# Patient Record
Sex: Female | Born: 1945
Health system: Southern US, Community
[De-identification: ages and names within clinical notes are randomized; demographics above are authoritative.]

## PROBLEM LIST (undated history)

## (undated) DIAGNOSIS — M1990 Unspecified osteoarthritis, unspecified site: Secondary | ICD-10-CM

## (undated) DIAGNOSIS — I10 Essential (primary) hypertension: Secondary | ICD-10-CM

## (undated) DIAGNOSIS — E785 Hyperlipidemia, unspecified: Secondary | ICD-10-CM

## (undated) HISTORY — DX: Essential (primary) hypertension: I10

## (undated) HISTORY — PX: EYE SURGERY: SHX253

## (undated) HISTORY — DX: Unspecified osteoarthritis, unspecified site: M19.90

## (undated) HISTORY — PX: APPENDECTOMY: SHX54

## (undated) HISTORY — PX: ABDOMINAL HYSTERECTOMY: SHX81

## (undated) HISTORY — PX: JOINT REPLACEMENT: SHX530

## (undated) HISTORY — DX: Hyperlipidemia, unspecified: E78.5

---

## 2019-06-10 ENCOUNTER — Encounter: Payer: Self-pay | Admitting: Family Medicine

## 2019-06-10 ENCOUNTER — Ambulatory Visit (INDEPENDENT_AMBULATORY_CARE_PROVIDER_SITE_OTHER): Payer: Medicare Other | Admitting: Family Medicine

## 2019-06-10 ENCOUNTER — Other Ambulatory Visit: Payer: Self-pay

## 2019-06-10 VITALS — BP 135/84 | HR 72 | Temp 98.1°F | Ht 67.0 in | Wt 150.0 lb

## 2019-06-10 DIAGNOSIS — Z1231 Encounter for screening mammogram for malignant neoplasm of breast: Secondary | ICD-10-CM | POA: Diagnosis not present

## 2019-06-10 DIAGNOSIS — E785 Hyperlipidemia, unspecified: Secondary | ICD-10-CM | POA: Insufficient documentation

## 2019-06-10 DIAGNOSIS — E7800 Pure hypercholesterolemia, unspecified: Secondary | ICD-10-CM

## 2019-06-10 DIAGNOSIS — Z7689 Persons encountering health services in other specified circumstances: Secondary | ICD-10-CM

## 2019-06-10 DIAGNOSIS — Z1159 Encounter for screening for other viral diseases: Secondary | ICD-10-CM

## 2019-06-10 DIAGNOSIS — I10 Essential (primary) hypertension: Secondary | ICD-10-CM | POA: Diagnosis not present

## 2019-06-10 DIAGNOSIS — Z1211 Encounter for screening for malignant neoplasm of colon: Secondary | ICD-10-CM

## 2019-06-10 MED ORDER — ATORVASTATIN CALCIUM 40 MG PO TABS: 40.0000 mg | ORAL_TABLET | Freq: Every day | ORAL | 3 refills | Status: DC

## 2019-06-10 MED ORDER — LOSARTAN POTASSIUM 100 MG PO TABS: 100.0000 mg | ORAL_TABLET | Freq: Every day | ORAL | 1 refills | Status: DC
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
## 2019-06-10 NOTE — Patient Instructions (Signed)
° ° ° °  If you have lab work done today you will be contacted with your lab results within the next 2 weeks.  If you have not heard from us then please contact us. The fastest way to get your results is to register for My Chart. ° ° °IF you received an x-ray today, you will receive an invoice from [facility]. Please contact [facility] at [phone] with questions or concerns regarding your invoice.  ° °IF you received labwork today, you will receive an invoice from [facility]. Please contact [facility] at [phone] with questions or concerns regarding your invoice.  ° °Our billing staff will not be able to assist you with questions regarding bills from these companies. ° °You will be contacted with the lab results as soon as they are available. The fastest way to get your results is to activate your My Chart account. Instructions are located on the last page of this paperwork. If you have not heard from us regarding the results in 2 weeks, please contact this office. °  ° ° ° °

## 2019-06-11 LAB — CMP14+EGFR
ALT: 14 IU/L (ref 0–32)
AST: 26 IU/L (ref 0–40)
Albumin/Globulin Ratio: 1.7 (ref 1.2–2.2)
Albumin: 4.5 g/dL (ref 3.7–4.7)
Alkaline Phosphatase: 72 IU/L (ref 39–117)
BUN/Creatinine Ratio: 18 (ref 12–28)
BUN: 12 mg/dL (ref 8–27)
Bilirubin Total: 0.5 mg/dL (ref 0.0–1.2)
CO2: 26 mmol/L (ref 20–29)
Calcium: 9.1 mg/dL (ref 8.7–10.3)
Chloride: 102 mmol/L (ref 96–106)
Creatinine, Ser: 0.67 mg/dL (ref 0.57–1.00)
GFR calc Af Amer: 99 mL/min/{1.73_m2} (ref >59)
GFR calc non Af Amer: 86 mL/min/{1.73_m2} (ref >59)
Globulin, Total: 2.7 g/dL (ref 1.5–4.5)
Glucose: 76 mg/dL (ref 65–99)
Potassium: 4 mmol/L (ref 3.5–5.2)
Sodium: 142 mmol/L (ref 134–144)
Total Protein: 7.2 g/dL (ref 6.0–8.5)

## 2019-06-11 LAB — LIPID PANEL
Chol/HDL Ratio: 2.1 ratio (ref 0.0–4.4)
Cholesterol, Total: 172 mg/dL (ref 100–199)
HDL: 81 mg/dL (ref >39)
LDL Chol Calc (NIH): 81 mg/dL (ref 0–99)
Triglycerides: 48 mg/dL (ref 0–149)
VLDL Cholesterol Cal: 10 mg/dL (ref 5–40)

## 2019-06-11 LAB — HEPATITIS C ANTIBODY: Hep C Virus Ab: <0.1 s/co ratio (ref 0.0–0.9)

## 2019-06-11 LAB — TSH: TSH: 2.51 u[IU]/mL (ref 0.450–4.500)

## 2019-06-16 ENCOUNTER — Encounter: Payer: Self-pay | Admitting: Radiology

## 2019-07-04 LAB — COLOGUARD: Cologuard: NEGATIVE

## 2019-07-08 NOTE — Progress Notes (Signed)
Spoke to pt, all is well 

## 2019-10-01 ENCOUNTER — Telehealth: Payer: Self-pay | Admitting: *Deleted

## 2019-10-01 NOTE — Telephone Encounter (Signed)
Schedule AWV.  

## 2019-10-02 ENCOUNTER — Telehealth: Payer: Self-pay | Admitting: Family Medicine

## 2019-10-02 NOTE — Telephone Encounter (Signed)
Pt returning call to schedule awv

## 2019-12-08 ENCOUNTER — Ambulatory Visit (INDEPENDENT_AMBULATORY_CARE_PROVIDER_SITE_OTHER): Payer: Medicare HMO

## 2019-12-08 ENCOUNTER — Other Ambulatory Visit: Payer: Self-pay

## 2019-12-08 ENCOUNTER — Ambulatory Visit (INDEPENDENT_AMBULATORY_CARE_PROVIDER_SITE_OTHER): Payer: Medicare HMO | Admitting: Family Medicine

## 2019-12-08 ENCOUNTER — Encounter: Payer: Self-pay | Admitting: Family Medicine

## 2019-12-08 VITALS — BP 104/86 | HR 67 | Temp 98.0°F | Ht 67.0 in | Wt 155.2 lb

## 2019-12-08 DIAGNOSIS — M7989 Other specified soft tissue disorders: Secondary | ICD-10-CM | POA: Diagnosis not present

## 2019-12-08 DIAGNOSIS — E7800 Pure hypercholesterolemia, unspecified: Secondary | ICD-10-CM

## 2019-12-08 DIAGNOSIS — M25441 Effusion, right hand: Secondary | ICD-10-CM | POA: Diagnosis not present

## 2019-12-08 DIAGNOSIS — I10 Essential (primary) hypertension: Secondary | ICD-10-CM | POA: Diagnosis not present

## 2019-12-08 MED ORDER — LOSARTAN POTASSIUM 100 MG PO TABS: 100.0000 mg | ORAL_TABLET | Freq: Every day | ORAL | 1 refills | Status: DC

## 2019-12-08 MED ORDER — ATORVASTATIN CALCIUM 40 MG PO TABS: 40.0000 mg | ORAL_TABLET | Freq: Every day | ORAL | 3 refills | Status: DC

## 2019-12-08 NOTE — Progress Notes (Signed)
5/17/20211:41 PM  Sandra Mcbride 07-Dec-1945, 74 y.o., female [id number]  Chief Complaint  Patient presents with  . Hypertension  . Pain    in 2 of the fingers, taking 585m of otc tylenol for the pain    HPI:   Patient is a 74y.o. female with past medical history significant for HTN and HLP who presents today for routine followup  Last OV nov 2020  She is overall doing well She has completed covid vaccines 1/19 and 2/9, pzifer She has bought a stationary bike Her grandson (whom lives with her) is graduating from highschool  Right 3rd and 4th finger IP joint swelling and stiffness No trauma Present for several months   [facility] 06/10/2019  Decreased Interest 0 0  Down, Depressed, Hopeless 0 0  PHQ - 2 Score 0 0    Fall Risk  12/08/2019 06/10/2019  Falls in the past year? 0 0  Number falls in past yr: 0 0  Injury with Fall? 0 0     No Known Allergies  Prior to Admission medications   Medication Sig Start Date End Date Taking? Authorizing Provider  atorvastatin (LIPITOR) 40 MG tablet Take 1 tablet (40 mg total) by mouth daily at 6 PM. 06/10/19  Yes SRutherford Guys MD  losartan (COZAAR) 100 MG tablet Take 1 tablet (100 mg total) by mouth daily. 06/10/19  Yes SRutherford Guys MD    Past Medical History:  Diagnosis Date  . Arthritis   . Hyperlipidemia   . Hypertension     Past Surgical History:  Procedure Laterality Date  . ABDOMINAL HYSTERECTOMY  1980s   partial, cervix remains, for fibroids  . APPENDECTOMY    . EYE SURGERY     cataracts, both eye    Social History   Tobacco Use  . Smoking status: Never Smoker  . Smokeless tobacco: Never Used  Substance Use Topics  . Alcohol use: Yes    Family History  Problem Relation Age of Onset  . Heart disease Mother   . Hypertension Mother   . Heart disease Father   . Hypertension Father   . Heart disease Brother   . Hypertension Brother   . Heart disease Daughter   .  Hypertension Daughter   . Schizophrenia Son   . Intellectual disability Son     Review of Systems  Constitutional: Negative for chills and fever.  Respiratory: Negative for cough and shortness of breath.   Cardiovascular: Negative for chest pain, palpitations and leg swelling.  Gastrointestinal: Negative for abdominal pain, nausea and vomiting.     OBJECTIVE:  Today's Vitals   12/08/19 1326  BP: 104/86  Pulse: 67  Temp: 98 F (36.7 C)  SpO2: 93%  Weight: 155 lb 3.2 oz (70.4 kg)  Height: 5' 7" (1.702 m)   Body mass index is 24.31 kg/m.   Wt Readings from Last 3 Encounters:  12/08/19 155 lb 3.2 oz (70.4 kg)  06/10/19 150 lb (68 kg)     Physical Exam Vitals and nursing note reviewed.  Constitutional:      Appearance: She is well-developed.  HENT:     Head: Normocephalic and atraumatic.     Mouth/Throat:     Pharynx: No oropharyngeal exudate.  Eyes:     General: No scleral icterus.    Conjunctiva/sclera: Conjunctivae normal.     Pupils: Pupils are equal, round, and reactive to light.  Cardiovascular:     Rate and Rhythm: Normal  rate and regular rhythm.     Heart sounds: Normal heart sounds. No murmur. No friction rub. No gallop.   Pulmonary:     Effort: Pulmonary effort is normal.     Breath sounds: Normal breath sounds. No wheezing or rales.  Musculoskeletal:     Cervical back: Neck supple.  Skin:    General: Skin is warm and dry.  Neurological:     Mental Status: She is alert and oriented to person, place, and time.     No results found for this or any previous visit (from the past 24 hour(s)).  DG Hand Complete Right  Result Date: 12/08/2019 CLINICAL DATA:  Right hand swelling for several months without known injury. EXAM: RIGHT HAND - COMPLETE 3+ VIEW COMPARISON:  None. FINDINGS: There is no evidence of fracture or dislocation. There is no evidence of arthropathy or other focal bone abnormality. Soft tissues are unremarkable. IMPRESSION: Negative.  Electronically Signed   By: Marijo Conception M.D.   On: 12/08/2019 14:58     ASSESSMENT and PLAN  1. Essential hypertension Controlled. Continue current regime.  - Lipid panel - CMP14+EGFR  2. Pure hypercholesterolemia Checking labs today, medications will be adjusted as needed.  - Lipid panel - CMP14+EGFR  3. Finger joint swelling, right Discussed conservative measures - DG Hand Complete Right - unremarkable  Other orders - atorvastatin (LIPITOR) 40 MG tablet; Take 1 tablet (40 mg total) by mouth daily at 6 PM. - losartan (COZAAR) 100 MG tablet; Take 1 tablet (100 mg total) by mouth daily.  Return in about 6 months (around 06/09/2020).    Rutherford Guys, MD Primary Care at [facility] [place], [place] [address] Ph.  [phone] Fax [phone]

## 2019-12-08 NOTE — Patient Instructions (Signed)
° ° ° °  If you have lab work done today you will be contacted with your lab results within the next 2 weeks.  If you have not heard from us then please contact us. The fastest way to get your results is to register for My Chart. ° ° °IF you received an x-ray today, you will receive an invoice from [facility]. Please contact [facility] at [phone] with questions or concerns regarding your invoice.  ° °IF you received labwork today, you will receive an invoice from [facility]. Please contact [facility] at [phone] with questions or concerns regarding your invoice.  ° °Our billing staff will not be able to assist you with questions regarding bills from these companies. ° °You will be contacted with the lab results as soon as they are available. The fastest way to get your results is to activate your My Chart account. Instructions are located on the last page of this paperwork. If you have not heard from us regarding the results in 2 weeks, please contact this office. °  ° ° ° °

## 2019-12-09 LAB — CMP14+EGFR
ALT: 13 IU/L (ref 0–32)
AST: 24 IU/L (ref 0–40)
Albumin/Globulin Ratio: 1.5 (ref 1.2–2.2)
Albumin: 4.4 g/dL (ref 3.7–4.7)
Alkaline Phosphatase: 66 IU/L (ref 48–121)
BUN/Creatinine Ratio: 13 (ref 12–28)
BUN: 9 mg/dL (ref 8–27)
Bilirubin Total: 0.9 mg/dL (ref 0.0–1.2)
CO2: 26 mmol/L (ref 20–29)
Calcium: 9.4 mg/dL (ref 8.7–10.3)
Chloride: 104 mmol/L (ref 96–106)
Creatinine, Ser: 0.68 mg/dL (ref 0.57–1.00)
GFR calc Af Amer: 99 mL/min/{1.73_m2} (ref >59)
GFR calc non Af Amer: 86 mL/min/{1.73_m2} (ref >59)
Globulin, Total: 2.9 g/dL (ref 1.5–4.5)
Glucose: 87 mg/dL (ref 65–99)
Potassium: 4.4 mmol/L (ref 3.5–5.2)
Sodium: 143 mmol/L (ref 134–144)
Total Protein: 7.3 g/dL (ref 6.0–8.5)

## 2019-12-09 LAB — LIPID PANEL
Chol/HDL Ratio: 2.5 ratio (ref 0.0–4.4)
Cholesterol, Total: 179 mg/dL (ref 100–199)
HDL: 72 mg/dL (ref >39)
LDL Chol Calc (NIH): 94 mg/dL (ref 0–99)
Triglycerides: 67 mg/dL (ref 0–149)
VLDL Cholesterol Cal: 13 mg/dL (ref 5–40)

## 2020-01-13 ENCOUNTER — Telehealth: Payer: Self-pay | Admitting: *Deleted

## 2020-01-13 ENCOUNTER — Ambulatory Visit (INDEPENDENT_AMBULATORY_CARE_PROVIDER_SITE_OTHER): Payer: Medicare HMO | Admitting: Emergency Medicine

## 2020-01-13 VITALS — BP 104/76 | Ht 67.0 in | Wt 155.0 lb

## 2020-01-13 DIAGNOSIS — Z0000 Encounter for general adult medical examination without abnormal findings: Secondary | ICD-10-CM

## 2020-01-13 NOTE — Telephone Encounter (Signed)
Schedule AWV.  

## 2020-01-13 NOTE — Telephone Encounter (Signed)
Spoke with pharmacy the atorvastatin is ready for pick up  Patient advised.

## 2020-01-13 NOTE — Patient Instructions (Addendum)
Thank you for taking time to come for your Medicare Wellness Visit. I appreciate your ongoing commitment to your health goals. Please review the following plan we discussed and let me know if I can assist you in the future.  Shatora Weatherbee LPN  Preventive Care 76 Years and Older, Female Preventive care refers to lifestyle choices and visits with your health care provider that can promote health and wellness. This includes:  A yearly physical exam. This is also called an annual well check.  Regular dental and eye exams.  Immunizations.  Screening for certain conditions.  Healthy lifestyle choices, such as diet and exercise. What can I expect for my preventive care visit? Physical exam Your health care provider will check:  Height and weight. These may be used to calculate body mass index (BMI), which is a measurement that tells if you are at a healthy weight.  Heart rate and blood pressure.  Your skin for abnormal spots. Counseling Your health care provider may ask you questions about:  Alcohol, tobacco, and drug use.  Emotional well-being.  Home and relationship well-being.  Sexual activity.  Eating habits.  History of falls.  Memory and ability to understand (cognition).  Work and work environment.  Pregnancy and menstrual history. What immunizations do I need?  Influenza (flu) vaccine  This is recommended every year. Tetanus, diphtheria, and pertussis (Tdap) vaccine  You may need a Td booster every 10 years. Varicella (chickenpox) vaccine  You may need this vaccine if you have not already been vaccinated. Zoster (shingles) vaccine  You may need this after age 60. Pneumococcal conjugate (PCV13) vaccine  One dose is recommended after age 65. Pneumococcal polysaccharide (PPSV23) vaccine  One dose is recommended after age 65. Measles, mumps, and rubella (MMR) vaccine  You may need at least one dose of MMR if you were born in 1957 or later. You may also  need a second dose. Meningococcal conjugate (MenACWY) vaccine  You may need this if you have certain conditions. Hepatitis A vaccine  You may need this if you have certain conditions or if you travel or work in places where you may be exposed to hepatitis A. Hepatitis B vaccine  You may need this if you have certain conditions or if you travel or work in places where you may be exposed to hepatitis B. Haemophilus influenzae type b (Hib) vaccine  You may need this if you have certain conditions. You may receive vaccines as individual doses or as more than one vaccine together in one shot (combination vaccines). Talk with your health care provider about the risks and benefits of combination vaccines. What tests do I need? Blood tests  Lipid and cholesterol levels. These may be checked every 5 years, or more frequently depending on your overall health.  Hepatitis C test.  Hepatitis B test. Screening  Lung cancer screening. You may have this screening every year starting at age 55 if you have a 30-pack-year history of smoking and currently smoke or have quit within the past 15 years.  Colorectal cancer screening. All adults should have this screening starting at age 50 and continuing until age 75. Your health care provider may recommend screening at age 45 if you are at increased risk. You will have tests every 1-10 years, depending on your results and the type of screening test.  Diabetes screening. This is done by checking your blood sugar (glucose) after you have not eaten for a while (fasting). You may have this done every 1-3   years.  Mammogram. This may be done every 1-2 years. Talk with your health care provider about how often you should have regular mammograms.  BRCA-related cancer screening. This may be done if you have a family history of breast, ovarian, tubal, or peritoneal cancers. Other tests  Sexually transmitted disease (STD) testing.  Bone density scan. This is done  to screen for osteoporosis. You may have this done starting at age 94. Follow these instructions at home: Eating and drinking  Eat a diet that includes fresh fruits and vegetables, whole grains, lean protein, and low-fat dairy products. Limit your intake of foods with high amounts of sugar, saturated fats, and salt.  Take vitamin and mineral supplements as recommended by your health care provider.  Do not drink alcohol if your health care provider tells you not to drink.  If you drink alcohol: ? Limit how much you have to 0-1 drink a day. ? Be aware of how much alcohol is in your drink. In the [place] one drink equals one 12 oz bottle of beer (355 mL), one 5 oz glass of wine (148 mL), or one 1 oz glass of hard liquor (44 mL). Lifestyle  Take daily care of your teeth and gums.  Stay active. Exercise for at least 30 minutes on 5 or more days each week.  Do not use any products that contain nicotine or tobacco, such as cigarettes, e-cigarettes, and chewing tobacco. If you need help quitting, ask your health care provider.  If you are sexually active, practice safe sex. Use a condom or other form of protection in order to prevent STIs (sexually transmitted infections).  Talk with your health care provider about taking a low-dose aspirin or statin. What's next?  Go to your health care provider once a year for a well check visit.  Ask your health care provider how often you should have your eyes and teeth checked.  Stay up to date on all vaccines. This information is not intended to replace advice given to you by your health care provider. Make sure you discuss any questions you have with your health care provider. Document Revised: 07/04/2018 Document Reviewed: 07/04/2018 Elsevier Patient Education  2020 [employer].

## 2020-01-13 NOTE — Progress Notes (Signed)
Presents today for [employer] Visit   Date of last exam: 12-08-2019  Interpreter used for this visit? No  I connected with  Sandra Mcbride on 01/13/20 by a telpehone  and verified that I am speaking with the correct person using two identifiers.   I discussed the limitations of evaluation and management by telemedicine. The patient expressed understanding and agreed to proceed.    Patient Care Team: Sandra Lipps, MD as PCP - General (Family Medicine)   Other items to address today:   Discussed Eye/Dental Discussed Immunizations   Other Screening: Last screening for diabetes: 12/08/2019 Last lipid screening: 12-08-2019  ADVANCE DIRECTIVES: Discussed: yes On File: no Materials Provided: yes  Immunization status:  Immunization History  Administered Date(s) Administered  . PFIZER SARS-COV-2 Vaccination 08/12/2019, 09/02/2019     There are no preventive care reminders to display for this patient.   Functional Status Survey: Is the patient deaf or have difficulty hearing?: No Does the patient have difficulty seeing, even when wearing glasses/contacts?: No Does the patient have difficulty concentrating, remembering, or making decisions?: No Does the patient have difficulty walking or climbing stairs?: No Does the patient have difficulty dressing or bathing?: No Does the patient have difficulty doing errands alone such as visiting a doctor's office or shopping?: No   6CIT Screen 01/13/2020  What Year? 0 points  What month? 0 points  What time? 0 points  Count back from 20 0 points  Months in reverse 0 points  Repeat phrase 0 points  Total Score 0        Clinical Support from 01/13/2020 in Primary Care at [facility]  AUDIT-C Score 7       Home Environment:    No difficulty climbing stairs No scattered rugs No grab bars Adequate lighting/ no clutter Lives in one story home   Patient Active Problem List   Diagnosis Date Noted  .  Hyperlipidemia   . Hypertension      Past Medical History:  Diagnosis Date  . Arthritis   . Hyperlipidemia   . Hypertension      Past Surgical History:  Procedure Laterality Date  . ABDOMINAL HYSTERECTOMY  1980s   partial, cervix remains, for fibroids  . APPENDECTOMY    . EYE SURGERY     cataracts, both eye  . JOINT REPLACEMENT N/A    Phreesia 01/13/2020     Family History  Problem Relation Age of Onset  . Heart disease Mother   . Hypertension Mother   . Heart disease Father   . Hypertension Father   . Heart disease Brother   . Hypertension Brother   . Heart disease Daughter   . Hypertension Daughter   . Schizophrenia Son   . Intellectual disability Son      Social History   Socioeconomic History  . Marital status: Widowed    Spouse name: Not on file  . Number of children: Not on file  . Years of education: Not on file  . Highest education level: Not on file  Occupational History  . Not on file  Tobacco Use  . Smoking status: Never Smoker  . Smokeless tobacco: Never Used  Substance and Sexual Activity  . Alcohol use: Yes  . Drug use: Never  . Sexual activity: Not Currently  Other Topics Concern  . Not on file  Social History Narrative  . Not on file   Social Determinants of Health   Financial Resource Strain:   .  Difficulty of Paying Living Expenses:   Food Insecurity:   . Worried About Charity fundraiser in the Last Year:   . Arboriculturist in the Last Year:   Transportation Needs:   . Film/video editor (Medical):   Marland Kitchen Lack of Transportation (Non-Medical):   Physical Activity:   . Days of Exercise per Week:   . Minutes of Exercise per Session:   Stress:   . Feeling of Stress :   Social Connections:   . Frequency of Communication with Friends and Family:   . Frequency of Social Gatherings with Friends and Family:   . Attends Religious Services:   . Active Member of Clubs or Organizations:   . Attends Archivist  Meetings:   Marland Kitchen Marital Status:   Intimate Partner Violence:   . Fear of Current or Ex-Partner:   . Emotionally Abused:   Marland Kitchen Physically Abused:   . Sexually Abused:      No Known Allergies   Prior to Admission medications   Medication Sig Start Date End Date Taking? Authorizing Provider  atorvastatin (LIPITOR) 40 MG tablet Take 1 tablet (40 mg total) by mouth daily at 6 PM. 12/08/19  Yes Rutherford Guys, MD  losartan (COZAAR) 100 MG tablet Take 1 tablet (100 mg total) by mouth daily. 12/08/19  Yes Rutherford Guys, MD     Depression screen [facility] 2/9 01/13/2020 12/08/2019 06/10/2019  Decreased Interest 0 0 0  Down, Depressed, Hopeless 0 0 0  PHQ - 2 Score 0 0 0     Fall Risk  01/13/2020 12/08/2019 06/10/2019  Falls in the past year? 1 0 0  Number falls in past yr: 0 0 0  Injury with Fall? 0 0 0  Follow up Falls evaluation completed;Education provided - -      PHYSICAL EXAM: BP 104/76 Comment: not in clinic  Ht 5\' 7"  (1.702 m)   Wt 155 lb (70.3 kg)   BMI 24.28 kg/m    Wt Readings from Last 3 Encounters:  01/13/20 155 lb (70.3 kg)  12/08/19 155 lb 3.2 oz (70.4 kg)  06/10/19 150 lb (68 kg)       Education/Counseling provided regarding diet and exercise, prevention of chronic diseases, smoking/tobacco cessation, if applicable, and reviewed "Covered Medicare Preventive Services."

## 2020-02-09 ENCOUNTER — Other Ambulatory Visit: Payer: Self-pay | Admitting: Family Medicine

## 2020-02-20 ENCOUNTER — Ambulatory Visit (HOSPITAL_COMMUNITY)
Admission: EM | Admit: 2020-02-20 | Discharge: 2020-02-20 | Disposition: A | Discharge status: Home / Self Care | Payer: Medicare HMO | Attending: Emergency Medicine | Admitting: Emergency Medicine

## 2020-02-20 ENCOUNTER — Encounter (HOSPITAL_COMMUNITY): Payer: Self-pay

## 2020-02-20 DIAGNOSIS — R42 Dizziness and giddiness: Secondary | ICD-10-CM

## 2020-02-20 DIAGNOSIS — R110 Nausea: Secondary | ICD-10-CM

## 2020-02-20 DIAGNOSIS — R030 Elevated blood-pressure reading, without diagnosis of hypertension: Secondary | ICD-10-CM

## 2020-02-20 LAB — POCT URINALYSIS DIP (DEVICE)
Bilirubin Urine: NEGATIVE
Glucose, UA: NEGATIVE mg/dL
Hgb urine dipstick: NEGATIVE
Ketones, ur: NEGATIVE mg/dL
Leukocytes,Ua: NEGATIVE
Nitrite: NEGATIVE
Protein, ur: NEGATIVE mg/dL
Specific Gravity, Urine: 1.01 (ref 1.005–1.030)
Urobilinogen, UA: 0.2 mg/dL (ref 0.0–1.0)
pH: 7 (ref 5.0–8.0)

## 2020-02-20 NOTE — ED Triage Notes (Signed)
Pt c/o dizziness, nausea, hazy/blurry vision,  weakness acute onset approx 2 hours PTA while doing house chores.  Denies CP, SOB, slurred speech, extremity weakness/numbness.   Has not taken HTN medication yet today.  States she awoke at approx 0100 two nights ago with "indigestion" at mid-epigastric area and drank cold water and sat up in recliner and symptom resolved.  AOx4, smile symmetrical, grips equal/strong, no arm drift, legs equal/strong. EKG and status of pt given to Marilynn Rail, NP who advised for pt to be taken to tx room for further eval when room available.

## 2020-02-20 NOTE — Discharge Instructions (Signed)
I do recommend further evaluation in the ER

## 2020-02-20 NOTE — ED Provider Notes (Signed)
[facility]    CSN: [id number] Arrival date & time: 02/20/20  1440      History   Chief Complaint Chief Complaint  Patient presents with   Dizziness    HPI Sandra Mcbride is a 74 y.o. female.   Sandra Mcbride presents with complaints of episode of feeling dizzy, very weak, light headed, associated with nausea. This started now approximately 3.5 hours ago, around 12:30 approximately. She was driving at that time. No diaphoresis. No shortness of breath . No chest pain . No confusion or speech difficulties. No spinning sensation. She felt nervous. She feels her symptoms have improved some, but still with lightheadedness and still feeling nauseated. History of "vertigo attacks" years ago which somewhat felt similar. No vomiting. No recent obvious dehydration or diarrhea. No fevers. She has been eating and drinking. Extensive family history of cardiac events- both parents and her brother have passed from MI. She takes medication for her BP as well as for cholesterol, which she did take today. States her BP is not typically as elevated as it is today.    ROS per HPI, negative if not otherwise mentioned.      Past Medical History:  Diagnosis Date   Arthritis    Hyperlipidemia    Hypertension     Patient Active Problem List   Diagnosis Date Noted   Hyperlipidemia    Hypertension     Past Surgical History:  Procedure Laterality Date   ABDOMINAL HYSTERECTOMY  1980s   partial, cervix remains, for fibroids   APPENDECTOMY     EYE SURGERY     cataracts, both eye   JOINT REPLACEMENT N/A    [employer]reesia 01/13/2020    OB History   No obstetric history on file.      Home Medications    Prior to Admission medications   Medication Sig Start Date End Date Taking? Authorizing Provider  atorvastatin (LIPITOR) 40 MG tablet Take 1 tablet (40 mg total) by mouth daily at 6 PM. 12/08/19  Yes Myles Lipps, MD  losartan (COZAAR) 100 MG tablet Take 1 tablet (100 mg  total) by mouth daily. 12/08/19  Yes Myles Lipps, MD    Family History Family History  Problem Relation Age of Onset   Heart disease Mother    Hypertension Mother    Heart disease Father    Hypertension Father    Heart disease Brother    Hypertension Brother    Heart disease Daughter    Hypertension Daughter    Schizophrenia Son    Intellectual disability Son     Social History Social History   Tobacco Use   Smoking status: Never Smoker   Smokeless tobacco: Never Used  Substance Use Topics   Alcohol use: Yes   Drug use: Never     Allergies   Patient has no known allergies.   Review of Systems Review of Systems   Physical Exam Triage Vital Signs ED Triage Vitals [02/20/20 1452]  Enc Vitals Group     BP (!) 166/80     Pulse Rate 68     Resp 18     Temp 98.1 F (36.7 C)     Temp Source Oral     SpO2 96 %     Weight      Height      Head Circumference      Peak Flow      Pain Score      Pain Loc  Pain Edu?      Excl. in GC?    Orthostatic VS for the past 24 hrs:  BP- Lying Pulse- Lying BP- Sitting Pulse- Sitting BP- Standing at 0 minutes Pulse- Standing at 0 minutes  02/20/20 1626 (!) 190/93 66 186/76 61 185/82 69    Updated Vital Signs BP (!) 166/80 (BP Location: Right Arm)    Pulse 68    Temp 98.1 F (36.7 C) (Oral)    Resp 18    SpO2 96%   Visual Acuity Right Eye Distance:   Left Eye Distance:   Bilateral Distance:    Right Eye Near:   Left Eye Near:    Bilateral Near:     Physical Exam Constitutional:      General: She is not in acute distress.    Appearance: She is well-developed.  HENT:     Head: Normocephalic and atraumatic.  Eyes:     Extraocular Movements: Extraocular movements intact.     Conjunctiva/sclera: Conjunctivae normal.     Pupils: Pupils are equal, round, and reactive to light.  Cardiovascular:     Rate and Rhythm: Normal rate.  Pulmonary:     Effort: Pulmonary effort is normal.     Breath  sounds: Normal breath sounds.  Skin:    General: Skin is warm and dry.  Neurological:     General: No focal deficit present.     Mental Status: She is alert and oriented to person, place, and time.     Cranial Nerves: No cranial nerve deficit.     Sensory: Sensation is intact.     Coordination: Romberg sign positive. Finger-Nose-Finger Test and Heel to [facility] Test normal.     Gait: Gait normal.     Comments: Subjective weakness; dizziness with laying and with collection of orthostatics   Psychiatric:        Mood and Affect: Mood normal.        Behavior: Behavior normal.        Thought Content: Thought content normal.     EKG:  NSR rate of 72 . Previous EKG was available for review. No stwave changes as interpreted by me.   UC Treatments / Results  Labs (all labs ordered are listed, but only abnormal results are displayed) Labs Reviewed  POCT URINALYSIS DIP (DEVICE)    EKG   Radiology No results found.  Procedures Procedures (including critical care time)  Medications Ordered in UC Medications - No data to display  Initial Impression / Assessment and Plan / UC Course  I have reviewed the triage vital signs and the nursing notes.  Pertinent labs & imaging results that were available during my care of the patient were reviewed by me and considered in my medical decision making (see chart for details).      Sudden onset of dizziness, weakness, nausea in this 74 yo patient. htn history but without previous documented elevation in bp as she exhibits today. Not orthostatic. Urine sg normal and otherwise normal ua. Positive romberg. No chest pain . ekg reassuring. acs vs cva vs tia vs vertigo vs metabolic source of symptoms considered. Patient agreeable to more thorough evaluation of her symptoms in the ER now.  Final Clinical Impressions(s) / UC Diagnoses   Final diagnoses:  Dizziness  Elevated blood pressure reading  Nausea     Discharge Instructions     I do  recommend further evaluation in the ER    ED Prescriptions    None  PDMP not reviewed this encounter.   Georgetta Haber, NP 02/20/20 1650

## 2020-02-20 NOTE — ED Notes (Signed)
Pt recanted and stated that she DID take her HTN meds this morning and c/o dizziness with lying down, chronic in nature.

## 2020-03-08 ENCOUNTER — Ambulatory Visit: Payer: Medicare HMO | Admitting: Family Medicine

## 2020-06-08 ENCOUNTER — Ambulatory Visit: Payer: Medicare HMO | Admitting: Family Medicine

## 2020-07-13 ENCOUNTER — Encounter: Payer: Self-pay | Admitting: Emergency Medicine

## 2020-07-13 ENCOUNTER — Ambulatory Visit (INDEPENDENT_AMBULATORY_CARE_PROVIDER_SITE_OTHER): Payer: Medicare HMO | Admitting: Emergency Medicine

## 2020-07-13 ENCOUNTER — Other Ambulatory Visit: Payer: Self-pay

## 2020-07-13 VITALS — BP 160/87 | HR 66 | Temp 97.8°F | Resp 16 | Ht 67.0 in | Wt 154.0 lb

## 2020-07-13 DIAGNOSIS — E785 Hyperlipidemia, unspecified: Secondary | ICD-10-CM

## 2020-07-13 DIAGNOSIS — I10 Essential (primary) hypertension: Secondary | ICD-10-CM

## 2020-07-13 MED ORDER — ATORVASTATIN CALCIUM 40 MG PO TABS: 40.0000 mg | ORAL_TABLET | Freq: Every day | ORAL | 3 refills | Status: AC

## 2020-07-13 MED ORDER — LOSARTAN POTASSIUM-HCTZ 100-12.5 MG PO TABS: 1.0000 | ORAL_TABLET | Freq: Every day | ORAL | 3 refills | Status: DC

## 2020-07-13 NOTE — Patient Instructions (Signed)

## 2020-07-13 NOTE — Progress Notes (Signed)
Sandra Mcbride 74 y.o.   Chief Complaint  Patient presents with  . Medication Refill    Atorvastatin and Losartan and review medication for blood pressure    HISTORY OF PRESENT ILLNESS: This is a 74 y.o. female here for follow-up of hypertension and medication refill. Used to see Dr. Leretha Pol. Presently on losartan but blood pressure readings at home have been high. Takes atorvastatin as a precaution.  Strong family history of atherosclerosis. No other complaints or medical concerns today. Scheduled for transfer of care visit next March. BP Readings from Last 3 Encounters:  07/13/20 (!) 160/87  02/20/20 (!) 166/80  01/13/20 104/76    HPI   Prior to Admission medications   Medication Sig Start Date End Date Taking? Authorizing Provider  atorvastatin (LIPITOR) 40 MG tablet Take 1 tablet (40 mg total) by mouth daily at 6 PM. 12/08/19  Yes Lezlie Lye, Meda Coffee, MD  losartan (COZAAR) 100 MG tablet Take 1 tablet (100 mg total) by mouth daily. 12/08/19  Yes Lezlie Lye, Meda Coffee, MD    No Known Allergies  Patient Active Problem List   Diagnosis Date Noted  . Hyperlipidemia   . Hypertension     Past Medical History:  Diagnosis Date  . Arthritis   . Hyperlipidemia   . Hypertension     Past Surgical History:  Procedure Laterality Date  . ABDOMINAL HYSTERECTOMY  1980s   partial, cervix remains, for fibroids  . APPENDECTOMY    . EYE SURGERY     cataracts, both eye  . JOINT REPLACEMENT N/A    [employer]reesia 01/13/2020    Social History   Socioeconomic History  . Marital status: Widowed    Spouse name: Not on file  . Number of children: Not on file  . Years of education: Not on file  . Highest education level: Not on file  Occupational History  . Not on file  Tobacco Use  . Smoking status: Never Smoker  . Smokeless tobacco: Never Used  Substance and Sexual Activity  . Alcohol use: Yes  . Drug use: Never  . Sexual activity: Not Currently  Other Topics Concern  .  Not on file  Social History Narrative  . Not on file   Social Determinants of Health   Financial Resource Strain: Not on file  Food Insecurity: Not on file  Transportation Needs: Not on file  Physical Activity: Not on file  Stress: Not on file  Social Connections: Not on file  Intimate Partner Violence: Not on file    Family History  Problem Relation Age of Onset  . Heart disease Mother   . Hypertension Mother   . Heart disease Father   . Hypertension Father   . Heart disease Brother   . Hypertension Brother   . Heart disease Daughter   . Hypertension Daughter   . Schizophrenia Son   . Intellectual disability Son      Review of Systems  Constitutional: Negative.  Negative for chills and fever.  HENT: Negative.  Negative for congestion and sore throat.   Respiratory: Negative.  Negative for cough and shortness of breath.   Cardiovascular: Negative.  Negative for chest pain and palpitations.  Gastrointestinal: Negative.  Negative for abdominal pain, blood in stool, diarrhea, nausea and vomiting.  Genitourinary: Negative.  Negative for dysuria and hematuria.  Musculoskeletal: Positive for joint pain. Negative for myalgias and neck pain.  Skin: Negative.  Negative for rash.  Neurological: Negative.  Negative for dizziness and headaches.  All other systems reviewed and are negative.   Vitals:   07/13/20 1405  BP: (!) 160/87  Pulse: 66  Resp: 16  Temp: 97.8 F (36.6 C)  SpO2: 94%   Wt Readings from Last 3 Encounters:  07/13/20 154 lb (69.9 kg)  01/13/20 155 lb (70.3 kg)  12/08/19 155 lb 3.2 oz (70.4 kg)    Physical Exam Vitals reviewed.  Constitutional:      Appearance: Normal appearance.  HENT:     Head: Normocephalic.  Eyes:     Extraocular Movements: Extraocular movements intact.     Pupils: Pupils are equal, round, and reactive to light.  Cardiovascular:     Rate and Rhythm: Normal rate and regular rhythm.     Pulses: Normal pulses.     Heart  sounds: Normal heart sounds.  Pulmonary:     Effort: Pulmonary effort is normal.     Breath sounds: Normal breath sounds.  Musculoskeletal:        General: Normal range of motion.     Cervical back: Normal range of motion and neck supple.  Skin:    General: Skin is warm and dry.     Capillary Refill: Capillary refill takes less than 2 seconds.  Neurological:     General: No focal deficit present.     Mental Status: She is alert and oriented to person, place, and time.  Psychiatric:        Mood and Affect: Mood normal.        Behavior: Behavior normal.      ASSESSMENT & PLAN: Hypertension Elevated blood pressure readings at home and at the office.  Will change losartan to losartan-HCTZ 100-12.5 mg daily. Continue monitoring blood pressure readings at home. Follow-up in 3 to 4 months.  [name] was seen today for medication refill.  Diagnoses and all orders for this visit:  Essential hypertension -     losartan-hydrochlorothiazide (HYZAAR) 100-12.5 MG tablet; Take 1 tablet by mouth daily.  Dyslipidemia -     atorvastatin (LIPITOR) 40 MG tablet; Take 1 tablet (40 mg total) by mouth daily at 6 PM.    Patient Instructions  Hypertension, Adult High blood pressure (hypertension) is when the force of blood pumping through the arteries is too strong. The arteries are the blood vessels that carry blood from the heart throughout the body. Hypertension forces the heart to work harder to pump blood and may cause arteries to become narrow or stiff. Untreated or uncontrolled hypertension can cause a heart attack, heart failure, a stroke, kidney disease, and other problems. A blood pressure reading consists of a higher number over a lower number. Ideally, your blood pressure should be below 120/80. The first ("top") number is called the systolic pressure. It is a measure of the pressure in your arteries as your heart beats. The second ("bottom") number is called the diastolic pressure. It is a  measure of the pressure in your arteries as the heart relaxes. What are the causes? The exact cause of this condition is not known. There are some conditions that result in or are related to high blood pressure. What increases the risk? Some risk factors for high blood pressure are under your control. The following factors may make you more likely to develop this condition:  Smoking.  Having type 2 diabetes mellitus, high cholesterol, or both.  Not getting enough exercise or physical activity.  Being overweight.  Having too much fat, sugar, calories, or salt (sodium) in your diet.  Drinking  too much alcohol. Some risk factors for high blood pressure may be difficult or impossible to change. Some of these factors include:  Having chronic kidney disease.  Having a family history of high blood pressure.  Age. Risk increases with age.  Race. You may be at higher risk if you are African American.  Gender. Men are at higher risk than women before age 46. After age 73, women are at higher risk than men.  Having obstructive sleep apnea.  Stress. What are the signs or symptoms? High blood pressure may not cause symptoms. Very high blood pressure (hypertensive crisis) may cause:  Headache.  Anxiety.  Shortness of breath.  Nosebleed.  Nausea and vomiting.  Vision changes.  Severe chest pain.  Seizures. How is this diagnosed? This condition is diagnosed by measuring your blood pressure while you are seated, with your arm resting on a flat surface, your legs uncrossed, and your feet flat on the floor. The cuff of the blood pressure monitor will be placed directly against the skin of your upper arm at the level of your heart. It should be measured at least twice using the same arm. Certain conditions can cause a difference in blood pressure between your right and left arms. Certain factors can cause blood pressure readings to be lower or higher than normal for a short period of  time:  When your blood pressure is higher when you are in a health care provider's office than when you are at home, this is called white coat hypertension. Most people with this condition do not need medicines.  When your blood pressure is higher at home than when you are in a health care provider's office, this is called masked hypertension. Most people with this condition may need medicines to control blood pressure. If you have a high blood pressure reading during one visit or you have normal blood pressure with other risk factors, you may be asked to:  Return on a different day to have your blood pressure checked again.  Monitor your blood pressure at home for 1 week or longer. If you are diagnosed with hypertension, you may have other blood or imaging tests to help your health care provider understand your overall risk for other conditions. How is this treated? This condition is treated by making healthy lifestyle changes, such as eating healthy foods, exercising more, and reducing your alcohol intake. Your health care provider may prescribe medicine if lifestyle changes are not enough to get your blood pressure under control, and if:  Your systolic blood pressure is above 130.  Your diastolic blood pressure is above 80. Your personal target blood pressure may vary depending on your medical conditions, your age, and other factors. Follow these instructions at home: Eating and drinking   Eat a diet that is high in fiber and potassium, and low in sodium, added sugar, and fat. An example eating plan is called the DASH (Dietary Approaches to Stop Hypertension) diet. To eat this way: ? Eat plenty of fresh fruits and vegetables. Try to fill one half of your plate at each meal with fruits and vegetables. ? Eat whole grains, such as whole-wheat pasta, brown rice, or whole-grain bread. Fill about one fourth of your plate with whole grains. ? Eat or drink low-fat dairy products, such as skim  milk or low-fat yogurt. ? Avoid fatty cuts of meat, processed or cured meats, and poultry with skin. Fill about one fourth of your plate with lean proteins, such as fish, chicken  without skin, beans, eggs, or tofu. ? Avoid pre-made and processed foods. These tend to be higher in sodium, added sugar, and fat.  Reduce your daily sodium intake. Most people with hypertension should eat less than 1,500 mg of sodium a day.  Do not drink alcohol if: ? Your health care provider tells you not to drink. ? You are pregnant, may be pregnant, or are planning to become pregnant.  If you drink alcohol: ? Limit how much you use to:  0-1 drink a day for women.  0-2 drinks a day for men. ? Be aware of how much alcohol is in your drink. In the [place] one drink equals one 12 oz bottle of beer (355 mL), one 5 oz glass of wine (148 mL), or one 1 oz glass of hard liquor (44 mL). Lifestyle   Work with your health care provider to maintain a healthy body weight or to lose weight. Ask what an ideal weight is for you.  Get at least 30 minutes of exercise most days of the week. Activities may include walking, swimming, or biking.  Include exercise to strengthen your muscles (resistance exercise), such as Pilates or lifting weights, as part of your weekly exercise routine. Try to do these types of exercises for 30 minutes at least 3 days a week.  Do not use any products that contain nicotine or tobacco, such as cigarettes, e-cigarettes, and chewing tobacco. If you need help quitting, ask your health care provider.  Monitor your blood pressure at home as told by your health care provider.  Keep all follow-up visits as told by your health care provider. This is important. Medicines  Take over-the-counter and prescription medicines only as told by your health care provider. Follow directions carefully. Blood pressure medicines must be taken as prescribed.  Do not skip doses of blood pressure medicine. Doing this  puts you at risk for problems and can make the medicine less effective.  Ask your health care provider about side effects or reactions to medicines that you should watch for. Contact a health care provider if you:  Think you are having a reaction to a medicine you are taking.  Have headaches that keep coming back (recurring).  Feel dizzy.  Have swelling in your ankles.  Have trouble with your vision. Get help right away if you:  Develop a severe headache or confusion.  Have unusual weakness or numbness.  Feel faint.  Have severe pain in your chest or abdomen.  Vomit repeatedly.  Have trouble breathing. Summary  Hypertension is when the force of blood pumping through your arteries is too strong. If this condition is not controlled, it may put you at risk for serious complications.  Your personal target blood pressure may vary depending on your medical conditions, your age, and other factors. For most people, a normal blood pressure is less than 120/80.  Hypertension is treated with lifestyle changes, medicines, or a combination of both. Lifestyle changes include losing weight, eating a healthy, low-sodium diet, exercising more, and limiting alcohol. This information is not intended to replace advice given to you by your health care provider. Make sure you discuss any questions you have with your health care provider. Document Revised: 03/20/2018 Document Reviewed: 03/20/2018 Elsevier Patient Education  2020 [employer]      Edwina Barth, MD [facility]

## 2020-07-13 NOTE — Assessment & Plan Note (Signed)
Elevated blood pressure readings at home and at the office.  Will change losartan to losartan-HCTZ 100-12.5 mg daily. Continue monitoring blood pressure readings at home. Follow-up in 3 to 4 months.

## 2020-07-21 ENCOUNTER — Other Ambulatory Visit: Payer: Self-pay

## 2020-07-21 ENCOUNTER — Encounter: Payer: Self-pay | Admitting: Registered Nurse

## 2020-07-21 ENCOUNTER — Ambulatory Visit (INDEPENDENT_AMBULATORY_CARE_PROVIDER_SITE_OTHER): Payer: Medicare HMO | Admitting: Registered Nurse

## 2020-07-21 VITALS — BP 139/76 | HR 61 | Temp 97.7°F | Resp 18 | Ht 67.0 in | Wt 153.4 lb

## 2020-07-21 DIAGNOSIS — I10 Essential (primary) hypertension: Secondary | ICD-10-CM

## 2020-07-21 MED ORDER — AMLODIPINE BESYLATE 2.5 MG PO TABS: 2.5000 mg | ORAL_TABLET | Freq: Every day | ORAL | 1 refills | Status: DC

## 2020-07-21 NOTE — Progress Notes (Signed)
Established Patient Office Visit  Subjective:  Patient ID: Sandra Mcbride, female    DOB: [birth year]  Age: 74 y.o. MRN: [id number]  CC:  Chief Complaint  Patient presents with  . Medication Reaction    Patient states she was prescribed a new blood pressure medication and she noticed that her blood pressure was dropping , dizziness, coughing.    HPI Sandra Mcbride presents for htn follow up   Hypertension: Patient Currently taking: losartan 100mg  PO Qd. Had been increased to losartan-hctz 100-12.5 starting one week ago. Had bp drop to 90s/60s. Dizzy, lightheaded, orthostatic symptoms. Stopped combo and resumed losartan 100mg  PO qd. Interested in alternative agent.  Denies CV symptoms including: chest pain, shob, doe, headache, visual changes, fatigue, claudication, and dependent edema.   Previous readings and labs: BP Readings from Last 3 Encounters:  07/21/20 139/76  07/13/20 (!) 160/87  02/20/20 (!) 166/80   Lab Results  Component Value Date   CREATININE 0.68 12/08/2019      Past Medical History:  Diagnosis Date  . Arthritis   . Hyperlipidemia   . Hypertension     Past Surgical History:  Procedure Laterality Date  . ABDOMINAL HYSTERECTOMY  1980s   partial, cervix remains, for fibroids  . APPENDECTOMY    . EYE SURGERY     cataracts, both eye  . JOINT REPLACEMENT N/A    Phreesia 01/13/2020    Family History  Problem Relation Age of Onset  . Heart disease Mother   . Hypertension Mother   . Heart disease Father   . Hypertension Father   . Heart disease Brother   . Hypertension Brother   . Heart disease Daughter   . Hypertension Daughter   . Schizophrenia Son   . Intellectual disability Son     Social History   Socioeconomic History  . Marital status: Widowed    Spouse name: Not on file  . Number of children: Not on file  . Years of education: Not on file  . Highest education level: Not on file  Occupational History  . Not on file  Tobacco Use  .  Smoking status: Never Smoker  . Smokeless tobacco: Never Used  Substance and Sexual Activity  . Alcohol use: Yes  . Drug use: Never  . Sexual activity: Not Currently  Other Topics Concern  . Not on file  Social History Narrative  . Not on file   Social Determinants of Health   Financial Resource Strain: Not on file  Food Insecurity: Not on file  Transportation Needs: Not on file  Physical Activity: Not on file  Stress: Not on file  Social Connections: Not on file  Intimate Partner Violence: Not on file    Outpatient Medications Prior to Visit  Medication Sig Dispense Refill  . atorvastatin (LIPITOR) 40 MG tablet Take 1 tablet (40 mg total) by mouth daily at 6 PM. 90 tablet 3  . losartan-hydrochlorothiazide (HYZAAR) 100-12.5 MG tablet Take 1 tablet by mouth daily. (Patient not taking: Reported on 07/21/2020) 90 tablet 3   No facility-administered medications prior to visit.    No Known Allergies  ROS Review of Systems  Constitutional: Negative.   HENT: Negative.   Eyes: Negative.   Respiratory: Negative.   Cardiovascular: Negative.   Gastrointestinal: Negative.   Genitourinary: Negative.   Musculoskeletal: Negative.   Skin: Negative.   Neurological: Negative.   Psychiatric/Behavioral: Negative.   All other systems reviewed and are negative.     Objective:  Physical Exam Vitals and nursing note reviewed.  Constitutional:      General: She is not in acute distress.    Appearance: Normal appearance. She is normal weight. She is not ill-appearing, toxic-appearing or diaphoretic.  Cardiovascular:     Rate and Rhythm: Normal rate and regular rhythm.     Heart sounds: Normal heart sounds. No murmur heard. No friction rub. No gallop.   Pulmonary:     Effort: Pulmonary effort is normal. No respiratory distress.     Breath sounds: Normal breath sounds. No stridor. No wheezing, rhonchi or rales.  Chest:     Chest wall: No tenderness.  Skin:    General: Skin is  warm and dry.  Neurological:     General: No focal deficit present.     Mental Status: She is alert and oriented to person, place, and time. Mental status is at baseline.  Psychiatric:        Mood and Affect: Mood normal.        Behavior: Behavior normal.        Thought Content: Thought content normal.        Judgment: Judgment normal.     BP 139/76   Pulse 61   Temp 97.7 F (36.5 C) (Temporal)   Resp 18   Ht 5\' 7"  (1.702 m)   Wt 153 lb 6.4 oz (69.6 kg)   SpO2 94%   BMI 24.03 kg/m  Wt Readings from Last 3 Encounters:  07/21/20 153 lb 6.4 oz (69.6 kg)  07/13/20 154 lb (69.9 kg)  01/13/20 155 lb (70.3 kg)     There are no preventive care reminders to display for this patient.  There are no preventive care reminders to display for this patient.  Lab Results  Component Value Date   TSH 2.510 06/10/2019   No results found for: WBC, HGB, HCT, MCV, PLT Lab Results  Component Value Date   NA 143 12/08/2019   K 4.4 12/08/2019   CO2 26 12/08/2019   GLUCOSE 87 12/08/2019   BUN 9 12/08/2019   CREATININE 0.68 12/08/2019   BILITOT 0.9 12/08/2019   ALKPHOS 66 12/08/2019   AST 24 12/08/2019   ALT 13 12/08/2019   PROT 7.3 12/08/2019   ALBUMIN 4.4 12/08/2019   CALCIUM 9.4 12/08/2019   Lab Results  Component Value Date   CHOL 179 12/08/2019   Lab Results  Component Value Date   HDL 72 12/08/2019   Lab Results  Component Value Date   LDLCALC 94 12/08/2019   Lab Results  Component Value Date   TRIG 67 12/08/2019   Lab Results  Component Value Date   CHOLHDL 2.5 12/08/2019   No results found for: HGBA1C    Assessment & Plan:   Problem List Items Addressed This Visit   None   Visit Diagnoses    Essential hypertension    -  Primary   Relevant Medications   amLODipine (NORVASC) 2.5 MG tablet      Meds ordered this encounter  Medications  . amLODipine (NORVASC) 2.5 MG tablet    Sig: Take 1 tablet (2.5 mg total) by mouth daily.    Dispense:  90 tablet     Refill:  1    Ok to disp refill early if patient needs for extended travel out of country.    Order Specific Question:   Supervising Provider    Answer:   12/10/2019, JEFFREY R [[id number]]    Follow-up: No follow-ups on file.  PLAN  bp borderline. Think there's an aspect of hypovolemia. May jump up again as she continues to replenish  Will start low dose amlodipine, 2.5mg  PO qd  Return in 3 mos  Patient encouraged to call clinic with any questions, comments, or concerns.  Janeece Agee, NP

## 2020-07-21 NOTE — Patient Instructions (Signed)
° ° ° °  If you have lab work done today you will be contacted with your lab results within the next 2 weeks.  If you have not heard from us then please contact us. The fastest way to get your results is to register for My Chart. ° ° °IF you received an x-ray today, you will receive an invoice from [facility]. Please contact [facility] at [phone] with questions or concerns regarding your invoice.  ° °IF you received labwork today, you will receive an invoice from [facility]. Please contact [facility] at [phone] with questions or concerns regarding your invoice.  ° °Our billing staff will not be able to assist you with questions regarding bills from these companies. ° °You will be contacted with the lab results as soon as they are available. The fastest way to get your results is to activate your My Chart account. Instructions are located on the last page of this paperwork. If you have not heard from us regarding the results in 2 weeks, please contact this office. °  ° ° ° °

## 2020-09-03 ENCOUNTER — Telehealth: Payer: Self-pay | Admitting: General Practice

## 2020-09-03 NOTE — Telephone Encounter (Signed)
Medication: losartan (COZAAR) 100 MG tablet [[id number]]  DISCONTINUED  Has the patient contacted their pharmacy? YES (Agent: If no, request that the patient contact the pharmacy for the refill.) (Agent: If yes, when and what did the pharmacy advise?)  Preferred Pharmacy (with phone number or street name): [facility] [address] ([address]), [place] - [address] [place] ([place]) [place] [address] Phone: [phone] Fax: [phone] Hours: Not open 24 hours    Agent: Please be advised that RX refills may take up to 3 business days. We ask that you follow-up with your pharmacy.

## 2020-09-03 NOTE — Telephone Encounter (Signed)
Refills available.

## 2020-09-06 ENCOUNTER — Other Ambulatory Visit: Payer: Self-pay

## 2020-09-06 MED ORDER — LOSARTAN POTASSIUM 100 MG PO TABS: 100.0000 mg | ORAL_TABLET | Freq: Every day | ORAL | 1 refills | Status: DC

## 2020-09-06 NOTE — Telephone Encounter (Signed)
Patient is in office needing refillls she has an reaction to  Losartan HCT / clinical will send losartan 100   Patient satisfied

## 2020-09-09 ENCOUNTER — Other Ambulatory Visit: Payer: Self-pay

## 2020-09-09 MED ORDER — LOSARTAN POTASSIUM 100 MG PO TABS: 100.0000 mg | ORAL_TABLET | Freq: Every day | ORAL | 1 refills | Status: DC

## 2020-09-09 NOTE — Telephone Encounter (Signed)
Patient called to follow up on Rx refill for   losartan-hydrochlorothiazide (HYZAAR) 100-12.5 MG tablet [[id number]]    Patient stated she called the pharmacy today and Rx wasn't called in. Spoke to nurse Thea Silversmith and she will resend it.   Pharmacy address confirmed with patient:  [facility] [address] ([address] [place] - [address]  [address] [place] ([place]) [place] [address]  Phone:  [phone] Fax:  [phone]

## 2020-09-27 ENCOUNTER — Ambulatory Visit (INDEPENDENT_AMBULATORY_CARE_PROVIDER_SITE_OTHER): Payer: Medicare HMO | Admitting: Emergency Medicine

## 2020-09-27 ENCOUNTER — Other Ambulatory Visit: Payer: Self-pay

## 2020-09-27 ENCOUNTER — Encounter: Payer: Self-pay | Admitting: Emergency Medicine

## 2020-09-27 VITALS — BP 138/78 | HR 73 | Temp 98.0°F | Resp 18 | Ht 67.0 in | Wt 157.4 lb

## 2020-09-27 DIAGNOSIS — Z7689 Persons encountering health services in other specified circumstances: Secondary | ICD-10-CM

## 2020-09-27 DIAGNOSIS — E785 Hyperlipidemia, unspecified: Secondary | ICD-10-CM

## 2020-09-27 DIAGNOSIS — I10 Essential (primary) hypertension: Secondary | ICD-10-CM | POA: Diagnosis not present

## 2020-09-27 DIAGNOSIS — M25441 Effusion, right hand: Secondary | ICD-10-CM | POA: Diagnosis not present

## 2020-09-27 NOTE — Progress Notes (Signed)
Sandra ClontsMartha Mcbride 75 y.o.   Chief Complaint  Patient presents with  . Transitions Of Care    Patient states she is here for her Tranfer of care.     HISTORY OF PRESENT ILLNESS: This is a 75 y.o. female with history of hypertension, here to establish/transfer care to me. Presently taking losartan 100 mg daily.  Normal blood pressure readings at home. Complaining of pain to right hand fingers mostly ring finger for several months.  Denies injury. Health maintenance items discussed with patient. No other complaints or medical concerns today.  HPI   Prior to Admission medications   Medication Sig Start Date End Date Taking? Authorizing Provider  amLODipine (NORVASC) 2.5 MG tablet Take 1 tablet (2.5 mg total) by mouth daily. 07/21/20   Janeece AgeeMorrow, Richard, NP  atorvastatin (LIPITOR) 40 MG tablet Take 1 tablet (40 mg total) by mouth daily at 6 PM. 07/13/20   Amilio Zehnder, Eilleen KempfMiguel Jose, MD  losartan (COZAAR) 100 MG tablet Take 1 tablet (100 mg total) by mouth daily. 09/09/20   Georgina QuintSagardia, Conway Fedora Jose, MD  losartan-hydrochlorothiazide [facility](HYZAAR) 100-12.5 MG tablet Take 1 tablet by mouth daily. Patient not taking: Reported on 07/21/2020 07/13/20   Georgina QuintSagardia, Vick Filter Jose, MD    No Known Allergies  Patient Active Problem List   Diagnosis Date Noted  . Hyperlipidemia   . Hypertension     Past Medical History:  Diagnosis Date  . Arthritis   . Hyperlipidemia   . Hypertension     Past Surgical History:  Procedure Laterality Date  . ABDOMINAL HYSTERECTOMY  1980s   partial, cervix remains, for fibroids  . APPENDECTOMY    . EYE SURGERY     cataracts, both eye  . JOINT REPLACEMENT N/A    Phreesia 01/13/2020    Social History   Socioeconomic History  . Marital status: Widowed    Spouse name: Not on file  . Number of children: Not on file  . Years of education: Not on file  . Highest education level: Not on file  Occupational History  . Not on file  Tobacco Use  . Smoking status: Never  Smoker  . Smokeless tobacco: Never Used  Substance and Sexual Activity  . Alcohol use: Yes  . Drug use: Never  . Sexual activity: Not Currently  Other Topics Concern  . Not on file  Social History Narrative  . Not on file   Social Determinants of Health   Financial Resource Strain: Not on file  Food Insecurity: Not on file  Transportation Needs: Not on file  Physical Activity: Not on file  Stress: Not on file  Social Connections: Not on file  Intimate Partner Violence: Not on file    Family History  Problem Relation Age of Onset  . Heart disease Mother   . Hypertension Mother   . Heart disease Father   . Hypertension Father   . Heart disease Brother   . Hypertension Brother   . Heart disease Daughter   . Hypertension Daughter   . Schizophrenia Son   . Intellectual disability Son      Review of Systems  Constitutional: Negative.  Negative for chills and fever.  HENT: Negative.  Negative for congestion and sore throat.   Respiratory: Negative.  Negative for cough and shortness of breath.   Cardiovascular: Negative.  Negative for chest pain and palpitations.  Gastrointestinal: Negative.  Negative for abdominal pain, diarrhea, nausea and vomiting.  Genitourinary: Negative.  Negative for dysuria and hematuria.  Musculoskeletal: Negative.  Negative for back pain, myalgias and neck pain.  Skin: Negative.  Negative for rash.  Neurological: Negative.  Negative for dizziness and headaches.  All other systems reviewed and are negative.   Today's Vitals   09/27/20 1312  BP: (!) 148/87  Pulse: 73  Resp: 18  Temp: 98 F (36.7 C)  TempSrc: Temporal  SpO2: 94%  Weight: 157 lb 6.4 oz (71.4 kg)  Height: 5\' 7"  (1.702 m)   Body mass index is 24.65 kg/m.  Physical Exam Vitals reviewed.  Constitutional:      Appearance: Normal appearance.  HENT:     Head: Normocephalic.  Eyes:     Extraocular Movements: Extraocular movements intact.     Pupils: Pupils are equal,  round, and reactive to light.  Cardiovascular:     Rate and Rhythm: Normal rate.  Pulmonary:     Effort: Pulmonary effort is normal.  Musculoskeletal:        General: Normal range of motion.     Cervical back: Normal range of motion.     Comments: Right hand: Positive swelling and tenderness to PIP joint of ring finger.  Some deformity seen on the fifth and third fingers PIP joints  Skin:    General: Skin is warm and dry.     Capillary Refill: Capillary refill takes less than 2 seconds.  Neurological:     General: No focal deficit present.     Mental Status: She is alert and oriented to person, place, and time.  Psychiatric:        Mood and Affect: Mood normal.        Behavior: Behavior normal.      ASSESSMENT & PLAN: Hypertension Well-controlled hypertension with normal blood pressure readings at home.  Continue losartan 100 mg daily.  Diet and nutrition discussed. Follow-up in 6 months.  Lupe was seen today for transitions of care.  Diagnoses and all orders for this visit:  Essential hypertension -     Comprehensive metabolic panel -     Hemoglobin A1c -     CBC with Differential/Platelet  Dyslipidemia -     Hemoglobin A1c -     Lipid panel -     CBC with Differential/Platelet  Finger joint swelling, right -     CBC with Differential/Platelet -     Ambulatory referral to Rheumatology -     ANA,IFA RA Diag Pnl w/rflx Tit/Patn  Encounter to establish care  Primary hypertension    Patient Instructions       If you have lab work done today you will be contacted with your lab results within the next 2 weeks.  If you have not heard from [facility] then please contact us. The fastest way to get your results is to register for My Chart.   IF you received an x-ray today, you will receive an invoice from [facility]. Please contact [facility] at [phone] with questions or concerns regarding your invoice.   IF you received labwork today, you will  receive an invoice from [facility]. Please contact [facility] at [phone] with questions or concerns regarding your invoice.   Our billing staff will not be able to assist you with questions regarding bills from these companies.  You will be contacted with the lab results as soon as they are available. The fastest way to get your results is to activate your My Chart account. Instructions are located on the last page of this paperwork. If you have not heard from [phone]  regarding the results in 2 weeks, please contact this office.      Hypertension, Adult High blood pressure (hypertension) is when the force of blood pumping through the arteries is too strong. The arteries are the blood vessels that carry blood from the heart throughout the body. Hypertension forces the heart to work harder to pump blood and may cause arteries to become narrow or stiff. Untreated or uncontrolled hypertension can cause a heart attack, heart failure, a stroke, kidney disease, and other problems. A blood pressure reading consists of a higher number over a lower number. Ideally, your blood pressure should be below 120/80. The first ("top") number is called the systolic pressure. It is a measure of the pressure in your arteries as your heart beats. The second ("bottom") number is called the diastolic pressure. It is a measure of the pressure in your arteries as the heart relaxes. What are the causes? The exact cause of this condition is not known. There are some conditions that result in or are related to high blood pressure. What increases the risk? Some risk factors for high blood pressure are under your control. The following factors may make you more likely to develop this condition:  Smoking.  Having type 2 diabetes mellitus, high cholesterol, or both.  Not getting enough exercise or physical activity.  Being overweight.  Having too much fat, sugar, calories, or salt (sodium) in your diet.  Drinking too much  alcohol. Some risk factors for high blood pressure may be difficult or impossible to change. Some of these factors include:  Having chronic kidney disease.  Having a family history of high blood pressure.  Age. Risk increases with age.  Race. You may be at higher risk if you are African American.  Gender. Men are at higher risk than women before age 52. After age 81, women are at higher risk than men.  Having obstructive sleep apnea.  Stress. What are the signs or symptoms? High blood pressure may not cause symptoms. Very high blood pressure (hypertensive crisis) may cause:  Headache.  Anxiety.  Shortness of breath.  Nosebleed.  Nausea and vomiting.  Vision changes.  Severe chest pain.  Seizures. How is this diagnosed? This condition is diagnosed by measuring your blood pressure while you are seated, with your arm resting on a flat surface, your legs uncrossed, and your feet flat on the floor. The cuff of the blood pressure monitor will be placed directly against the skin of your upper arm at the level of your heart. It should be measured at least twice using the same arm. Certain conditions can cause a difference in blood pressure between your right and left arms. Certain factors can cause blood pressure readings to be lower or higher than normal for a short period of time:  When your blood pressure is higher when you are in a health care provider's office than when you are at home, this is called white coat hypertension. Most people with this condition do not need medicines.  When your blood pressure is higher at home than when you are in a health care provider's office, this is called masked hypertension. Most people with this condition may need medicines to control blood pressure. If you have a high blood pressure reading during one visit or you have normal blood pressure with other risk factors, you may be asked to:  Return on a different day to have your blood  pressure checked again.  Monitor your blood pressure at home for  1 week or longer. If you are diagnosed with hypertension, you may have other blood or imaging tests to help your health care provider understand your overall risk for other conditions. How is this treated? This condition is treated by making healthy lifestyle changes, such as eating healthy foods, exercising more, and reducing your alcohol intake. Your health care provider may prescribe medicine if lifestyle changes are not enough to get your blood pressure under control, and if:  Your systolic blood pressure is above 130.  Your diastolic blood pressure is above 80. Your personal target blood pressure may vary depending on your medical conditions, your age, and other factors. Follow these instructions at home: Eating and drinking  Eat a diet that is high in fiber and potassium, and low in sodium, added sugar, and fat. An example eating plan is called the DASH (Dietary Approaches to Stop Hypertension) diet. To eat this way: ? Eat plenty of fresh fruits and vegetables. Try to fill one half of your plate at each meal with fruits and vegetables. ? Eat whole grains, such as whole-wheat pasta, brown rice, or whole-grain bread. Fill about one fourth of your plate with whole grains. ? Eat or drink low-fat dairy products, such as skim milk or low-fat yogurt. ? Avoid fatty cuts of meat, processed or cured meats, and poultry with skin. Fill about one fourth of your plate with lean proteins, such as fish, chicken without skin, beans, eggs, or tofu. ? Avoid pre-made and processed foods. These tend to be higher in sodium, added sugar, and fat.  Reduce your daily sodium intake. Most people with hypertension should eat less than 1,500 mg of sodium a day.  Do not drink alcohol if: ? Your health care provider tells you not to drink. ? You are pregnant, may be pregnant, or are planning to become pregnant.  If you drink alcohol: ? Limit how  much you use to:  0-1 drink a day for women.  0-2 drinks a day for men. ? Be aware of how much alcohol is in your drink. In the [place] one drink equals one 12 oz bottle of beer (355 mL), one 5 oz glass of wine (148 mL), or one 1 oz glass of hard liquor (44 mL).   Lifestyle  Work with your health care provider to maintain a healthy body weight or to lose weight. Ask what an ideal weight is for you.  Get at least 30 minutes of exercise most days of the week. Activities may include walking, swimming, or biking.  Include exercise to strengthen your muscles (resistance exercise), such as Pilates or lifting weights, as part of your weekly exercise routine. Try to do these types of exercises for 30 minutes at least 3 days a week.  Do not use any products that contain nicotine or tobacco, such as cigarettes, e-cigarettes, and chewing tobacco. If you need help quitting, ask your health care provider.  Monitor your blood pressure at home as told by your health care provider.  Keep all follow-up visits as told by your health care provider. This is important.   Medicines  Take over-the-counter and prescription medicines only as told by your health care provider. Follow directions carefully. Blood pressure medicines must be taken as prescribed.  Do not skip doses of blood pressure medicine. Doing this puts you at risk for problems and can make the medicine less effective.  Ask your health care provider about side effects or reactions to medicines that you should watch for. Contact  a health care provider if you:  Think you are having a reaction to a medicine you are taking.  Have headaches that keep coming back (recurring).  Feel dizzy.  Have swelling in your ankles.  Have trouble with your vision. Get help right away if you:  Develop a severe headache or confusion.  Have unusual weakness or numbness.  Feel faint.  Have severe pain in your chest or abdomen.  Vomit repeatedly.  Have  trouble breathing. Summary  Hypertension is when the force of blood pumping through your arteries is too strong. If this condition is not controlled, it may put you at risk for serious complications.  Your personal target blood pressure may vary depending on your medical conditions, your age, and other factors. For most people, a normal blood pressure is less than 120/80.  Hypertension is treated with lifestyle changes, medicines, or a combination of both. Lifestyle changes include losing weight, eating a healthy, low-sodium diet, exercising more, and limiting alcohol. This information is not intended to replace advice given to you by your health care provider. Make sure you discuss any questions you have with your health care provider. Document Revised: 03/20/2018 Document Reviewed: 03/20/2018 Elsevier Patient Education  2021 [employer] Inc.      Edwina Barth, MD [facility]

## 2020-09-27 NOTE — Patient Instructions (Addendum)
   If you have lab work done today you will be contacted with your lab results within the next 2 weeks.  If you have not heard from us then please contact us. The fastest way to get your results is to register for My Chart.   IF you received an x-ray today, you will receive an invoice from [facility]. Please contact [facility] at [phone] with questions or concerns regarding your invoice.   IF you received labwork today, you will receive an invoice from [facility]. Please contact [facility] at [phone] with questions or concerns regarding your invoice.   Our billing staff will not be able to assist you with questions regarding bills from these companies.  You will be contacted with the lab results as soon as they are available. The fastest way to get your results is to activate your My Chart account. Instructions are located on the last page of this paperwork. If you have not heard from us regarding the results in 2 weeks, please contact this office.     Hypertension, Adult High blood pressure (hypertension) is when the force of blood pumping through the arteries is too strong. The arteries are the blood vessels that carry blood from the heart throughout the body. Hypertension forces the heart to work harder to pump blood and may cause arteries to become narrow or stiff. Untreated or uncontrolled hypertension can cause a heart attack, heart failure, a stroke, kidney disease, and other problems. A blood pressure reading consists of a higher number over a lower number. Ideally, your blood pressure should be below 120/80. The first ("top") number is called the systolic pressure. It is a measure of the pressure in your arteries as your heart beats. The second ("bottom") number is called the diastolic pressure. It is a measure of the pressure in your arteries as the heart relaxes. What are the causes? The exact cause of this condition is not known. There are some conditions  that result in or are related to high blood pressure. What increases the risk? Some risk factors for high blood pressure are under your control. The following factors may make you more likely to develop this condition:  Smoking.  Having type 2 diabetes mellitus, high cholesterol, or both.  Not getting enough exercise or physical activity.  Being overweight.  Having too much fat, sugar, calories, or salt (sodium) in your diet.  Drinking too much alcohol. Some risk factors for high blood pressure may be difficult or impossible to change. Some of these factors include:  Having chronic kidney disease.  Having a family history of high blood pressure.  Age. Risk increases with age.  Race. You may be at higher risk if you are African American.  Gender. Men are at higher risk than women before age 45. After age 65, women are at higher risk than men.  Having obstructive sleep apnea.  Stress. What are the signs or symptoms? High blood pressure may not cause symptoms. Very high blood pressure (hypertensive crisis) may cause:  Headache.  Anxiety.  Shortness of breath.  Nosebleed.  Nausea and vomiting.  Vision changes.  Severe chest pain.  Seizures. How is this diagnosed? This condition is diagnosed by measuring your blood pressure while you are seated, with your arm resting on a flat surface, your legs uncrossed, and your feet flat on the floor. The cuff of the blood pressure monitor will be placed directly against the skin of your upper arm at the level of your heart.   It should be measured at least twice using the same arm. Certain conditions can cause a difference in blood pressure between your right and left arms. Certain factors can cause blood pressure readings to be lower or higher than normal for a short period of time:  When your blood pressure is higher when you are in a health care provider's office than when you are at home, this is called white coat hypertension.  Most people with this condition do not need medicines.  When your blood pressure is higher at home than when you are in a health care provider's office, this is called masked hypertension. Most people with this condition may need medicines to control blood pressure. If you have a high blood pressure reading during one visit or you have normal blood pressure with other risk factors, you may be asked to:  Return on a different day to have your blood pressure checked again.  Monitor your blood pressure at home for 1 week or longer. If you are diagnosed with hypertension, you may have other blood or imaging tests to help your health care provider understand your overall risk for other conditions. How is this treated? This condition is treated by making healthy lifestyle changes, such as eating healthy foods, exercising more, and reducing your alcohol intake. Your health care provider may prescribe medicine if lifestyle changes are not enough to get your blood pressure under control, and if:  Your systolic blood pressure is above 130.  Your diastolic blood pressure is above 80. Your personal target blood pressure may vary depending on your medical conditions, your age, and other factors. Follow these instructions at home: Eating and drinking  Eat a diet that is high in fiber and potassium, and low in sodium, added sugar, and fat. An example eating plan is called the DASH (Dietary Approaches to Stop Hypertension) diet. To eat this way: ? Eat plenty of fresh fruits and vegetables. Try to fill one half of your plate at each meal with fruits and vegetables. ? Eat whole grains, such as whole-wheat pasta, brown rice, or whole-grain bread. Fill about one fourth of your plate with whole grains. ? Eat or drink low-fat dairy products, such as skim milk or low-fat yogurt. ? Avoid fatty cuts of meat, processed or cured meats, and poultry with skin. Fill about one fourth of your plate with lean proteins, such  as fish, chicken without skin, beans, eggs, or tofu. ? Avoid pre-made and processed foods. These tend to be higher in sodium, added sugar, and fat.  Reduce your daily sodium intake. Most people with hypertension should eat less than 1,500 mg of sodium a day.  Do not drink alcohol if: ? Your health care provider tells you not to drink. ? You are pregnant, may be pregnant, or are planning to become pregnant.  If you drink alcohol: ? Limit how much you use to:  0-1 drink a day for women.  0-2 drinks a day for men. ? Be aware of how much alcohol is in your drink. In the [place] one drink equals one 12 oz bottle of beer (355 mL), one 5 oz glass of wine (148 mL), or one 1 oz glass of hard liquor (44 mL).   Lifestyle  Work with your health care provider to maintain a healthy body weight or to lose weight. Ask what an ideal weight is for you.  Get at least 30 minutes of exercise most days of the week. Activities may include walking, swimming, or   biking.  Include exercise to strengthen your muscles (resistance exercise), such as Pilates or lifting weights, as part of your weekly exercise routine. Try to do these types of exercises for 30 minutes at least 3 days a week.  Do not use any products that contain nicotine or tobacco, such as cigarettes, e-cigarettes, and chewing tobacco. If you need help quitting, ask your health care provider.  Monitor your blood pressure at home as told by your health care provider.  Keep all follow-up visits as told by your health care provider. This is important.   Medicines  Take over-the-counter and prescription medicines only as told by your health care provider. Follow directions carefully. Blood pressure medicines must be taken as prescribed.  Do not skip doses of blood pressure medicine. Doing this puts you at risk for problems and can make the medicine less effective.  Ask your health care provider about side effects or reactions to medicines that you  should watch for. Contact a health care provider if you:  Think you are having a reaction to a medicine you are taking.  Have headaches that keep coming back (recurring).  Feel dizzy.  Have swelling in your ankles.  Have trouble with your vision. Get help right away if you:  Develop a severe headache or confusion.  Have unusual weakness or numbness.  Feel faint.  Have severe pain in your chest or abdomen.  Vomit repeatedly.  Have trouble breathing. Summary  Hypertension is when the force of blood pumping through your arteries is too strong. If this condition is not controlled, it may put you at risk for serious complications.  Your personal target blood pressure may vary depending on your medical conditions, your age, and other factors. For most people, a normal blood pressure is less than 120/80.  Hypertension is treated with lifestyle changes, medicines, or a combination of both. Lifestyle changes include losing weight, eating a healthy, low-sodium diet, exercising more, and limiting alcohol. This information is not intended to replace advice given to you by your health care provider. Make sure you discuss any questions you have with your health care provider. Document Revised: 03/20/2018 Document Reviewed: 03/20/2018 Elsevier Patient Education  2021 [employer]  

## 2020-09-27 NOTE — Assessment & Plan Note (Signed)
Well-controlled hypertension with normal blood pressure readings at home.  Continue losartan 100 mg daily.  Diet and nutrition discussed. Follow-up in 6 months.

## 2020-09-28 LAB — COMPREHENSIVE METABOLIC PANEL
ALT: 18 IU/L (ref 0–32)
AST: 26 IU/L (ref 0–40)
Albumin/Globulin Ratio: 1.6 (ref 1.2–2.2)
Albumin: 4.2 g/dL (ref 3.7–4.7)
Alkaline Phosphatase: 58 IU/L (ref 44–121)
BUN/Creatinine Ratio: 15 (ref 12–28)
BUN: 9 mg/dL (ref 8–27)
Bilirubin Total: 0.5 mg/dL (ref 0.0–1.2)
CO2: 26 mmol/L (ref 20–29)
Calcium: 9.3 mg/dL (ref 8.7–10.3)
Chloride: 102 mmol/L (ref 96–106)
Creatinine, Ser: 0.61 mg/dL (ref 0.57–1.00)
Globulin, Total: 2.7 g/dL (ref 1.5–4.5)
Glucose: 88 mg/dL (ref 65–99)
Potassium: 4.3 mmol/L (ref 3.5–5.2)
Sodium: 140 mmol/L (ref 134–144)
Total Protein: 6.9 g/dL (ref 6.0–8.5)
eGFR: 93 mL/min/{1.73_m2} (ref >59)

## 2020-09-28 LAB — CBC WITH DIFFERENTIAL/PLATELET
Basophils Absolute: 0 10*3/uL (ref 0.0–0.2)
Basos: 1 %
EOS (ABSOLUTE): 0.1 10*3/uL (ref 0.0–0.4)
Eos: 2 %
Hematocrit: 40.6 % (ref 34.0–46.6)
Hemoglobin: 14 g/dL (ref 11.1–15.9)
Immature Grans (Abs): 0 10*3/uL (ref 0.0–0.1)
Immature Granulocytes: 0 %
Lymphocytes Absolute: 1.5 10*3/uL (ref 0.7–3.1)
Lymphs: 36 %
MCH: 34.9 pg — ABNORMAL HIGH (ref 26.6–33.0)
MCHC: 34.5 g/dL (ref 31.5–35.7)
MCV: 101 fL — ABNORMAL HIGH (ref 79–97)
Monocytes Absolute: 0.3 10*3/uL (ref 0.1–0.9)
Monocytes: 8 %
Neutrophils Absolute: 2.2 10*3/uL (ref 1.4–7.0)
Neutrophils: 53 %
Platelets: 230 10*3/uL (ref 150–450)
RBC: 4.01 x10E6/uL (ref 3.77–5.28)
RDW: 11.6 % — ABNORMAL LOW (ref 11.7–15.4)
WBC: 4.1 10*3/uL (ref 3.4–10.8)

## 2020-09-28 LAB — LIPID PANEL
Chol/HDL Ratio: 2.3 ratio (ref 0.0–4.4)
Cholesterol, Total: 169 mg/dL (ref 100–199)
HDL: 75 mg/dL (ref >39)
LDL Chol Calc (NIH): 81 mg/dL (ref 0–99)
Triglycerides: 66 mg/dL (ref 0–149)
VLDL Cholesterol Cal: 13 mg/dL (ref 5–40)

## 2020-09-28 LAB — HEMOGLOBIN A1C
Est. average glucose Bld gHb Est-mCnc: 111 mg/dL
Hgb A1c MFr Bld: 5.5 % (ref 4.8–5.6)

## 2020-09-30 ENCOUNTER — Telehealth: Payer: Self-pay

## 2020-09-30 NOTE — Telephone Encounter (Signed)
-----   Message from [facility], MD sent at 09/28/2020 12:27 PM EST ----- Call patient please.  Normal labs.  Thanks.

## 2020-10-01 NOTE — Telephone Encounter (Signed)
Lab records printed and mailed for DOS 09/27/20

## 2020-10-01 NOTE — Telephone Encounter (Signed)
Patient called in and rea pt note labs normal patient understood . Patient  would like a copy of her labs mailed to her . Address on file

## 2020-10-22 ENCOUNTER — Other Ambulatory Visit: Payer: Self-pay | Admitting: Emergency Medicine

## 2020-11-11 ENCOUNTER — Other Ambulatory Visit: Payer: Self-pay | Admitting: Emergency Medicine

## 2020-11-11 DIAGNOSIS — M25542 Pain in joints of left hand: Secondary | ICD-10-CM

## 2020-11-11 DIAGNOSIS — M25541 Pain in joints of right hand: Secondary | ICD-10-CM

## 2021-01-25 DIAGNOSIS — D7589 Other specified diseases of blood and blood-forming organs: Secondary | ICD-10-CM | POA: Diagnosis not present

## 2021-01-25 DIAGNOSIS — Z7289 Other problems related to lifestyle: Secondary | ICD-10-CM | POA: Diagnosis not present

## 2021-01-25 DIAGNOSIS — E7800 Pure hypercholesterolemia, unspecified: Secondary | ICD-10-CM | POA: Diagnosis not present

## 2021-01-25 DIAGNOSIS — R110 Nausea: Secondary | ICD-10-CM | POA: Diagnosis not present

## 2021-01-25 DIAGNOSIS — I10 Essential (primary) hypertension: Secondary | ICD-10-CM | POA: Diagnosis not present

## 2021-01-25 DIAGNOSIS — F4321 Adjustment disorder with depressed mood: Secondary | ICD-10-CM | POA: Diagnosis not present

## 2021-03-06 ENCOUNTER — Other Ambulatory Visit: Payer: Self-pay | Admitting: Emergency Medicine

## 2021-04-14 ENCOUNTER — Other Ambulatory Visit: Payer: Self-pay | Admitting: Gastroenterology

## 2021-04-14 DIAGNOSIS — B1910 Unspecified viral hepatitis B without hepatic coma: Secondary | ICD-10-CM | POA: Diagnosis not present

## 2021-04-14 DIAGNOSIS — B181 Chronic viral hepatitis B without delta-agent: Secondary | ICD-10-CM

## 2021-04-19 ENCOUNTER — Ambulatory Visit
Admission: RE | Admit: 2021-04-19 | Discharge: 2021-04-19 | Disposition: A | Discharge status: Home / Self Care | Payer: Medicare HMO | Source: Ambulatory Visit | Attending: Gastroenterology | Admitting: Gastroenterology

## 2021-04-19 DIAGNOSIS — B181 Chronic viral hepatitis B without delta-agent: Secondary | ICD-10-CM | POA: Diagnosis not present

## 2021-04-19 DIAGNOSIS — B182 Chronic viral hepatitis C: Secondary | ICD-10-CM | POA: Diagnosis not present

## 2021-04-25 IMAGING — DX DG HAND COMPLETE 3+V*R*
3 series · 3 of 3 positions shown · non-contrast
Comparison: None.

CLINICAL DATA: Right hand swelling for several months without known
injury.

EXAM:
RIGHT HAND - COMPLETE 3+ VIEW

[hand pa]
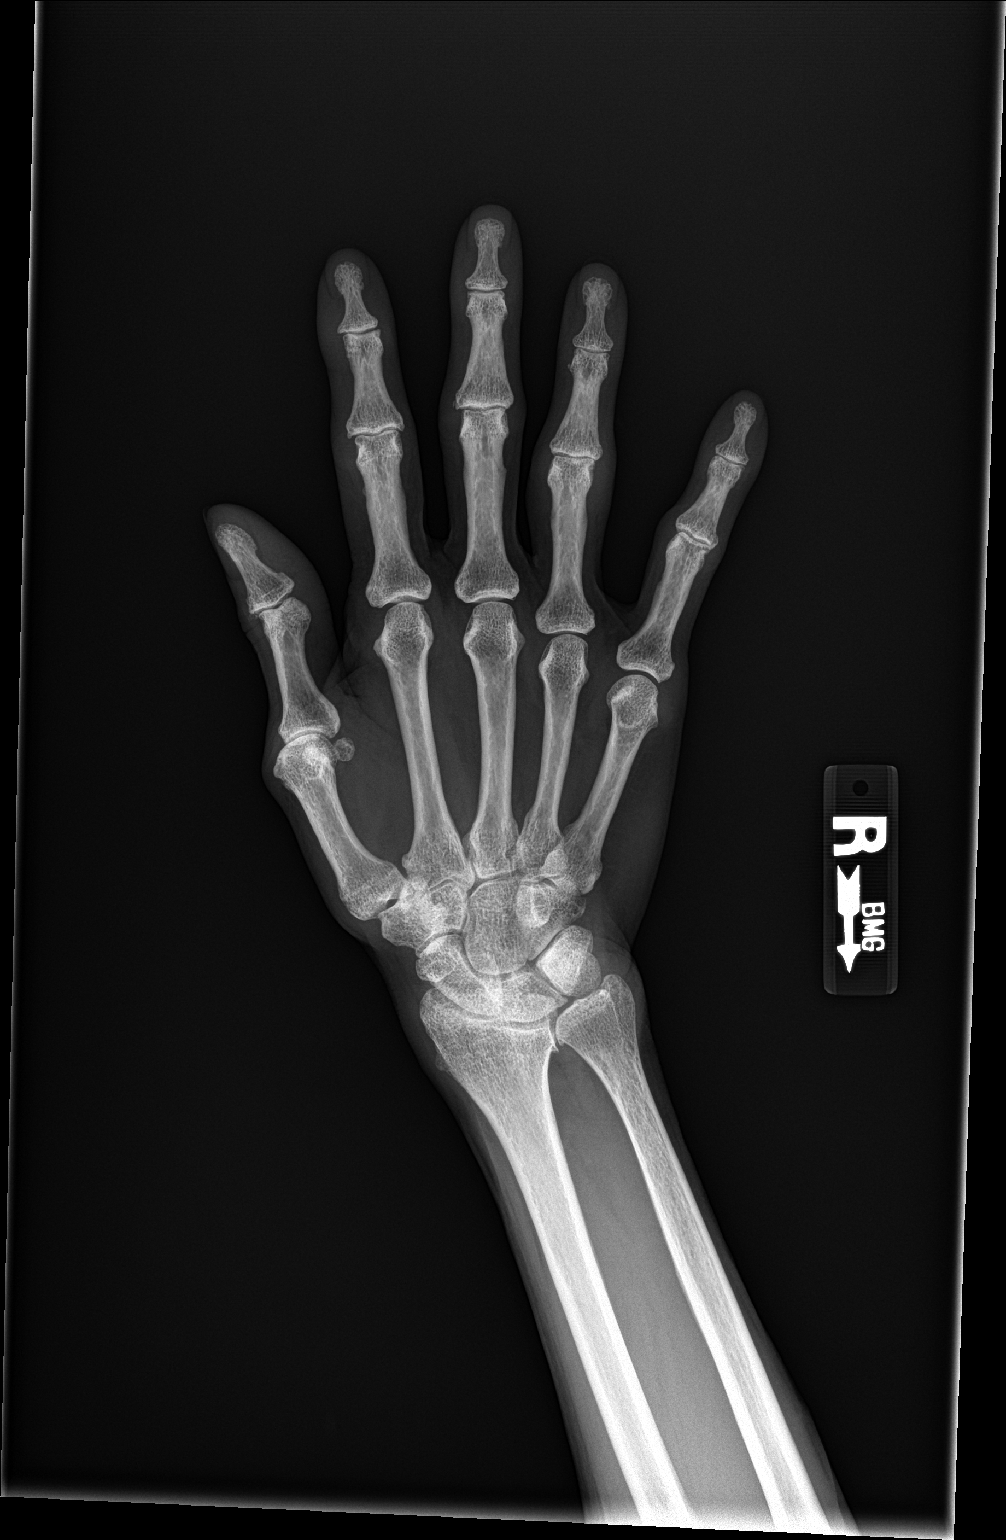

[hand obl]
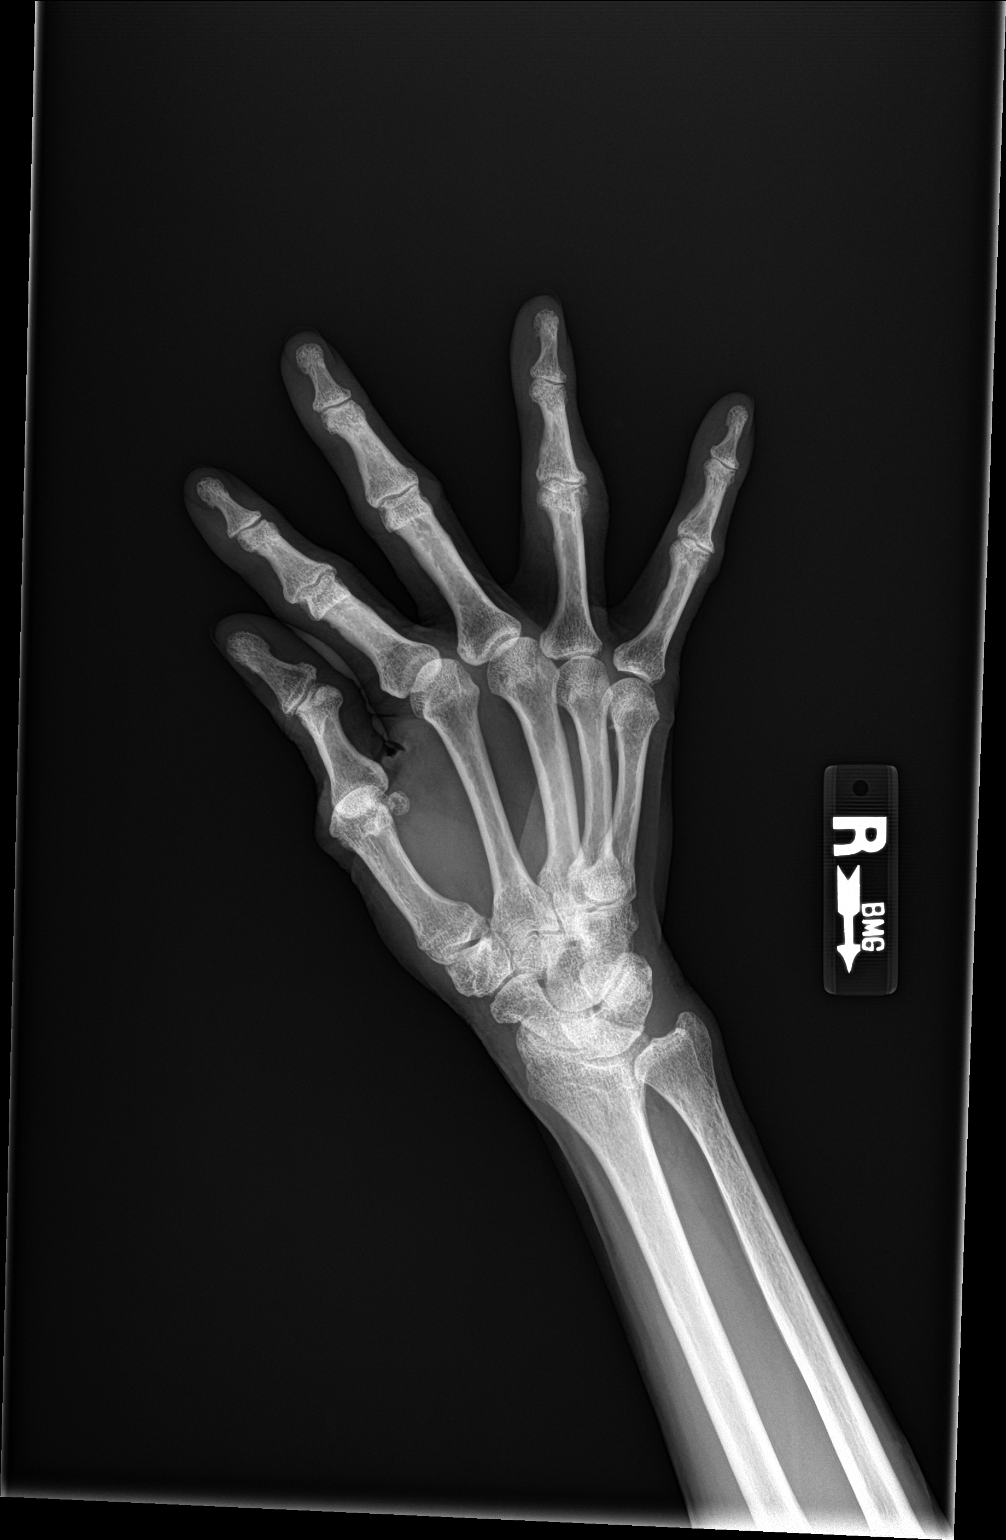

[hand lat]
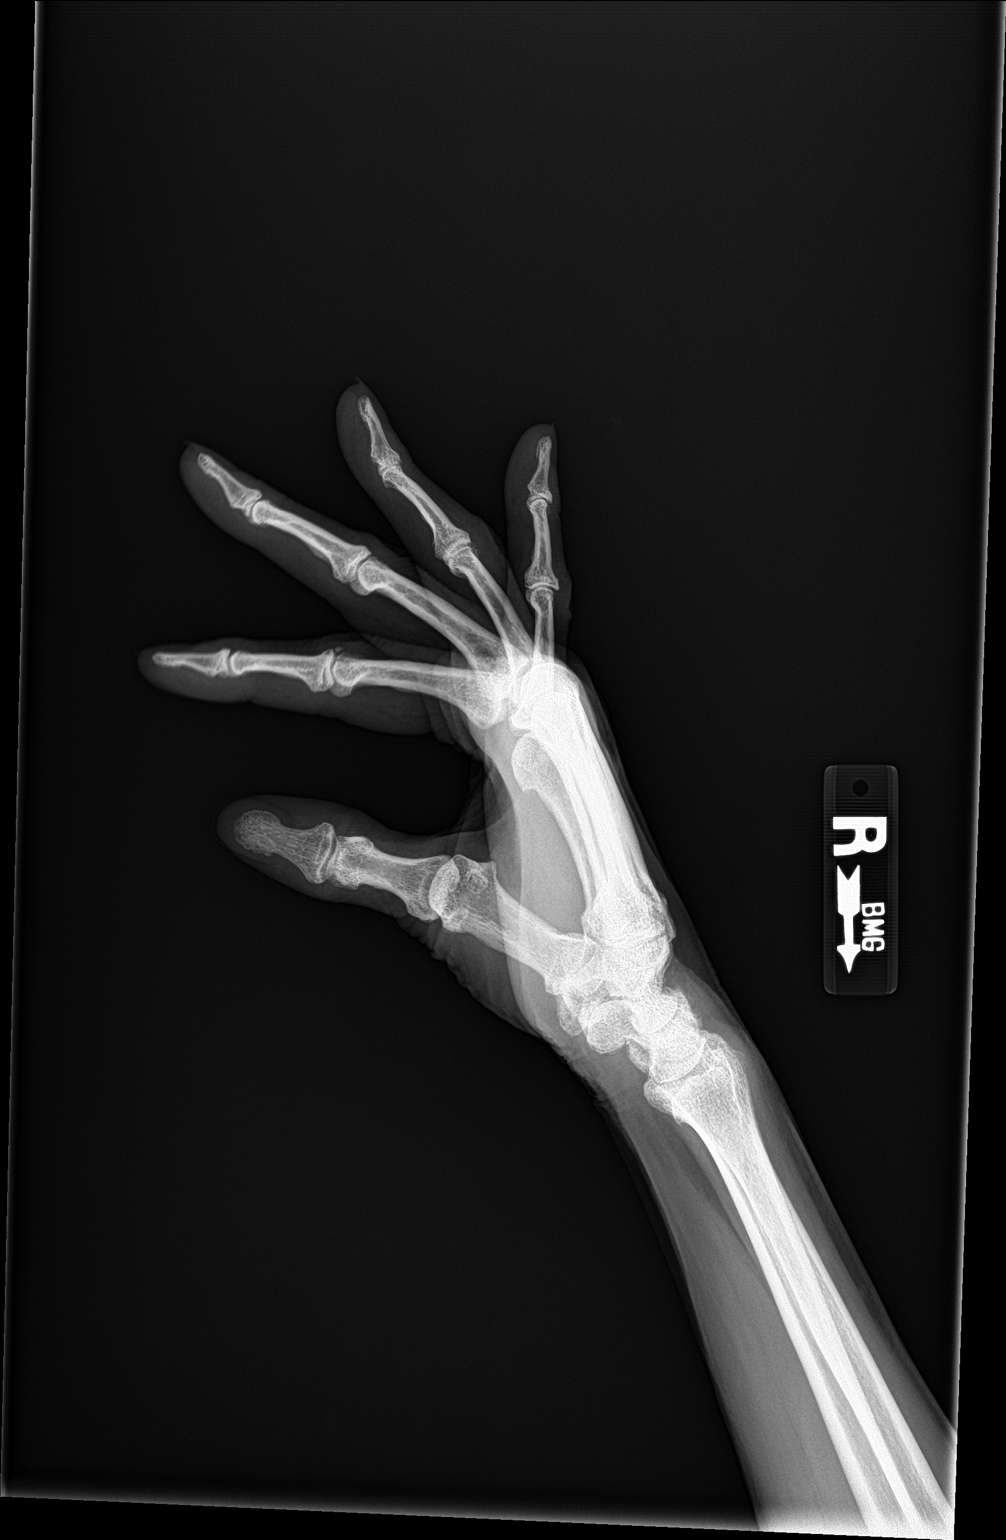

[3 of 3 positions shown; findings below may reference images not displayed]

FINDINGS: There is no evidence of fracture or dislocation. There is no
evidence of arthropathy or other focal bone abnormality. Soft
tissues are unremarkable.
IMPRESSION: Negative.

## 2021-04-26 DIAGNOSIS — B1910 Unspecified viral hepatitis B without hepatic coma: Secondary | ICD-10-CM | POA: Diagnosis not present

## 2021-05-05 DIAGNOSIS — D7589 Other specified diseases of blood and blood-forming organs: Secondary | ICD-10-CM | POA: Diagnosis not present

## 2021-05-05 DIAGNOSIS — I10 Essential (primary) hypertension: Secondary | ICD-10-CM | POA: Diagnosis not present

## 2021-05-05 DIAGNOSIS — F321 Major depressive disorder, single episode, moderate: Secondary | ICD-10-CM | POA: Diagnosis not present

## 2021-05-05 DIAGNOSIS — Z0000 Encounter for general adult medical examination without abnormal findings: Secondary | ICD-10-CM | POA: Diagnosis not present

## 2021-05-05 DIAGNOSIS — E7800 Pure hypercholesterolemia, unspecified: Secondary | ICD-10-CM | POA: Diagnosis not present

## 2022-02-14 ENCOUNTER — Other Ambulatory Visit: Payer: Self-pay | Admitting: Gastroenterology

## 2022-02-14 DIAGNOSIS — B181 Chronic viral hepatitis B without delta-agent: Secondary | ICD-10-CM

## 2022-02-20 ENCOUNTER — Ambulatory Visit
Admission: RE | Admit: 2022-02-20 | Discharge: 2022-02-20 | Disposition: A | Discharge status: Home / Self Care | Payer: Medicare HMO | Source: Ambulatory Visit | Attending: Gastroenterology | Admitting: Gastroenterology

## 2022-02-20 DIAGNOSIS — B181 Chronic viral hepatitis B without delta-agent: Secondary | ICD-10-CM

## 2022-07-08 LAB — EXTERNAL GENERIC LAB PROCEDURE: COLOGUARD: NEGATIVE

## 2022-07-08 LAB — COLOGUARD: COLOGUARD: NEGATIVE

## 2022-09-05 IMAGING — US US ABDOMEN COMPLETE
1 series · 14 of 25 positions shown · non-contrast
Comparison: None.

CLINICAL DATA: Chronic viral hepatitis B without delta agent and
without coma (HCC)

EXAM:
ABDOMEN ULTRASOUND COMPLETE

[Series 1: us abdomen complete · 0.15mm/px · 14 of 72 slices shown]
[im 1/72]
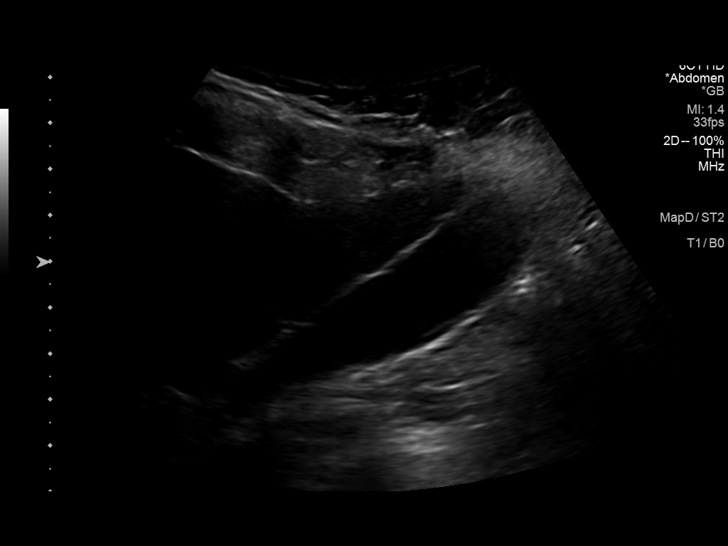
[im 6/72]
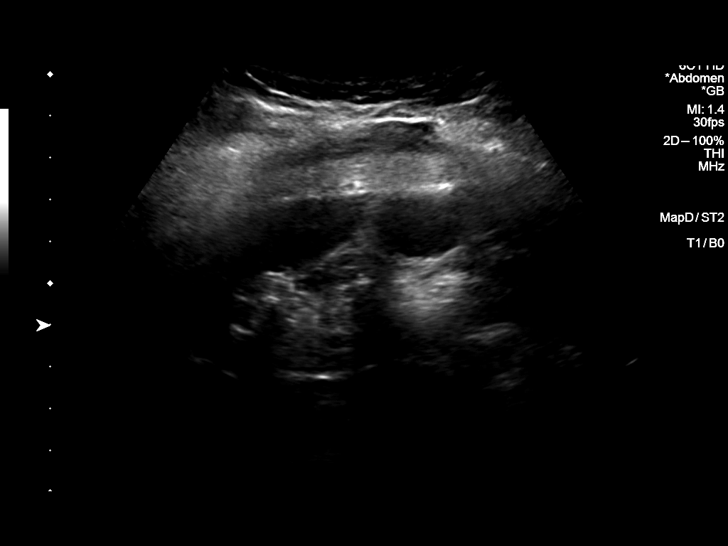
[im 12/72]
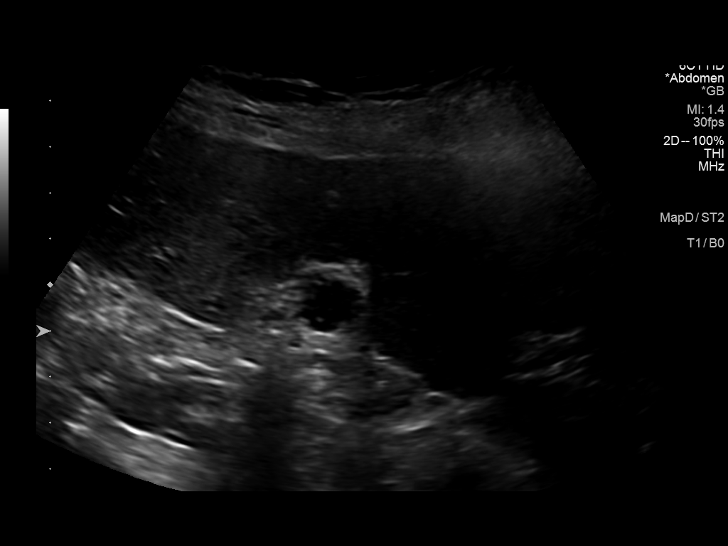
[im 18/72]
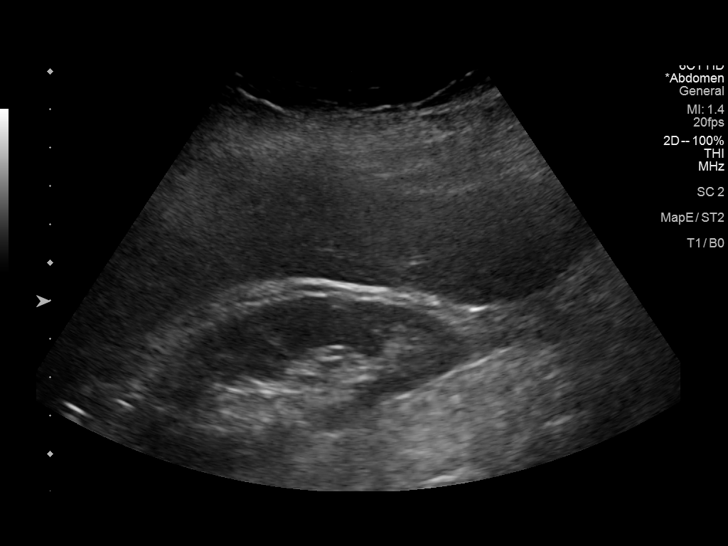
[im 24/72]
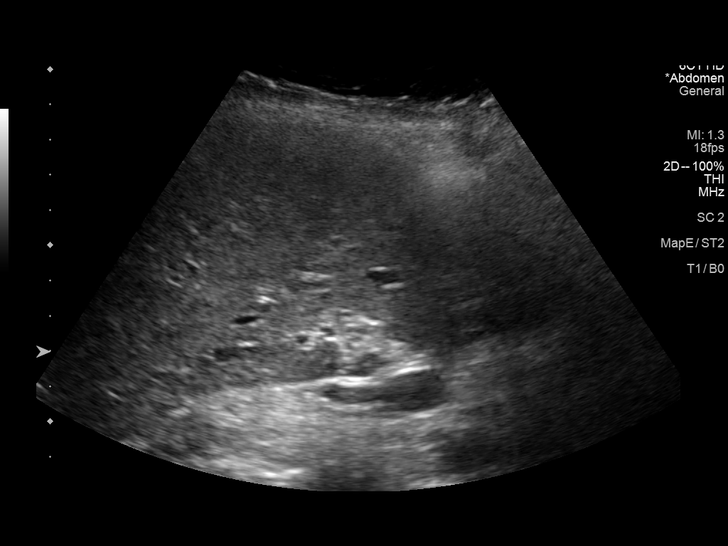
[im 27/72]
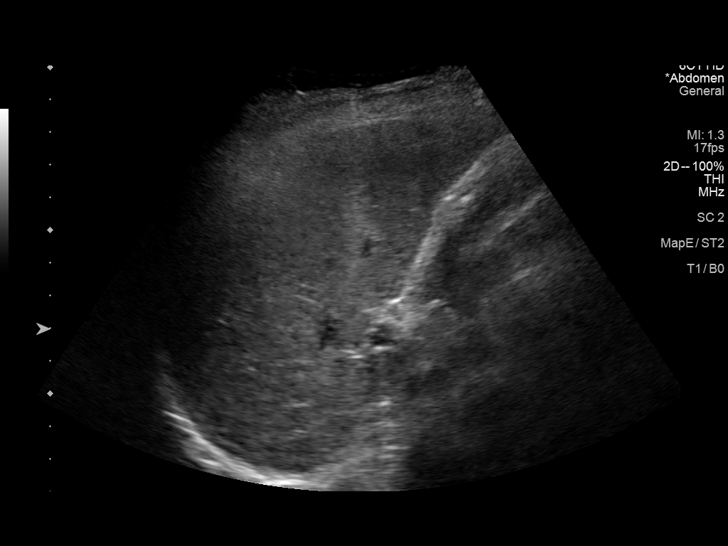
[im 33/72]
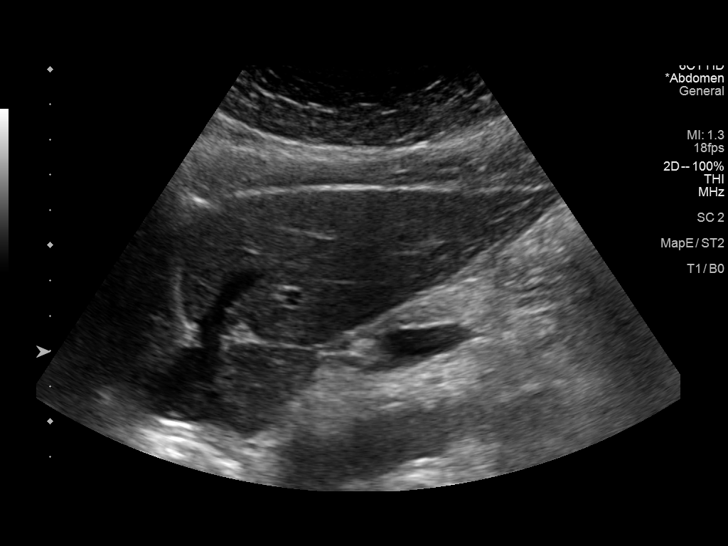
[im 39/72]
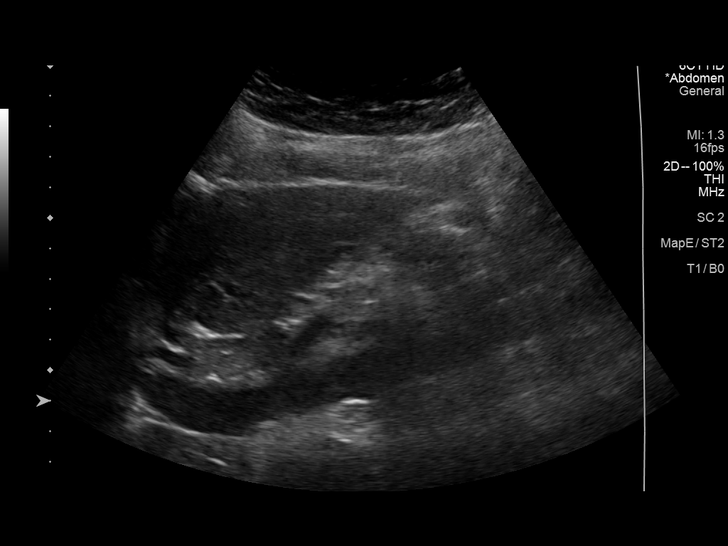
[im 45/72]
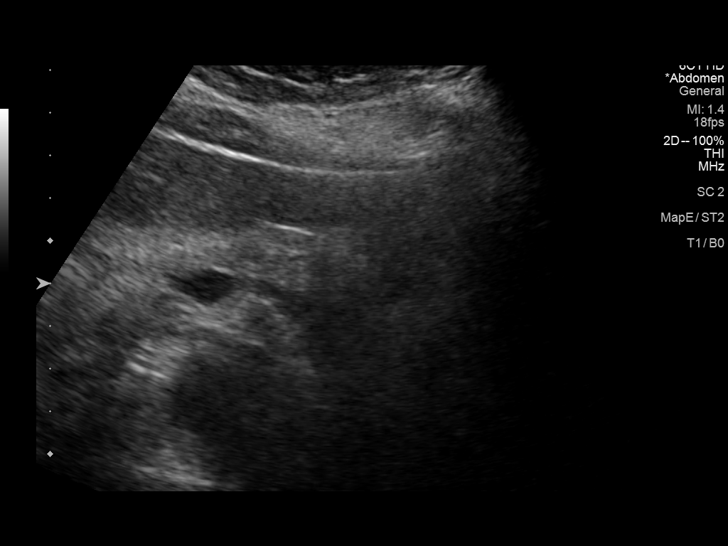
[im 48/72]
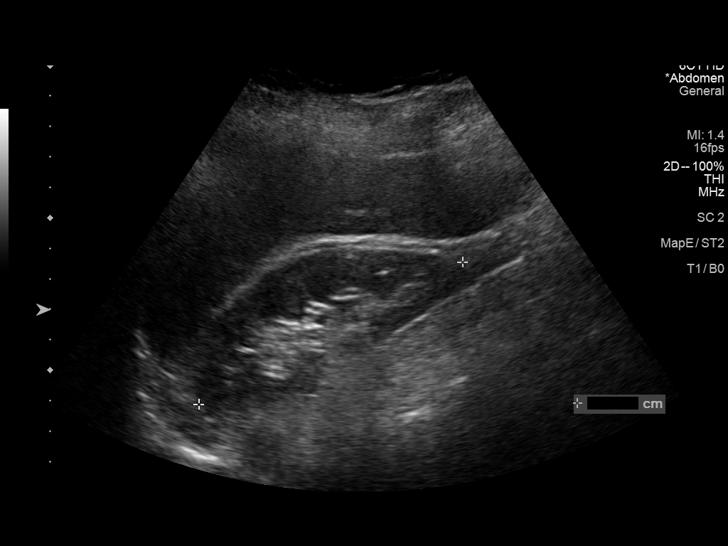
[im 54/72]
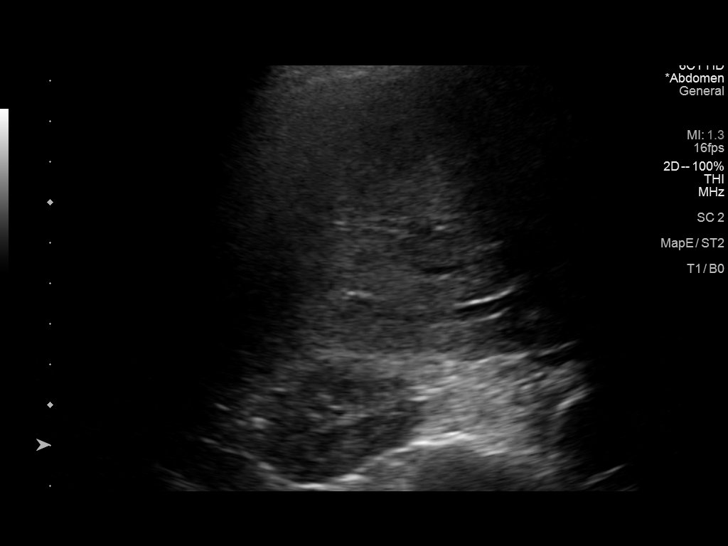
[im 60/72]
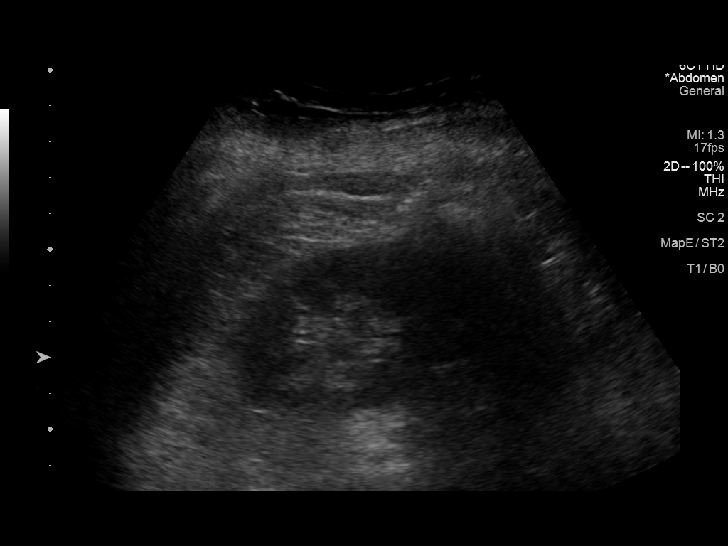
[im 66/72]
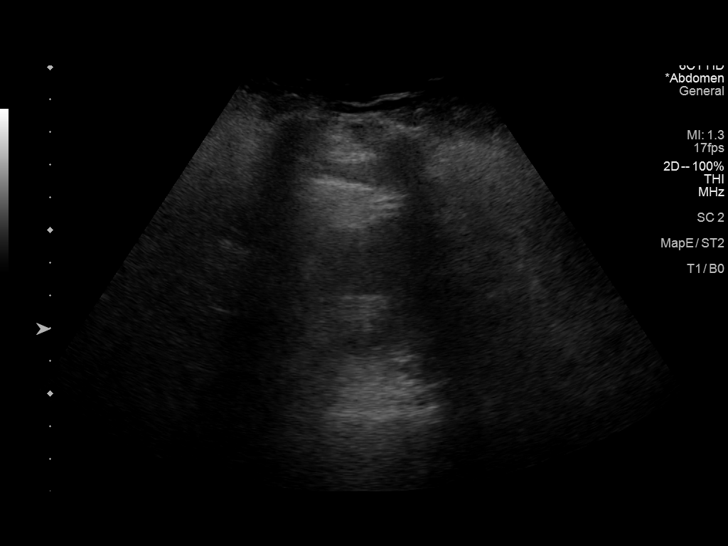
[im 72/72]
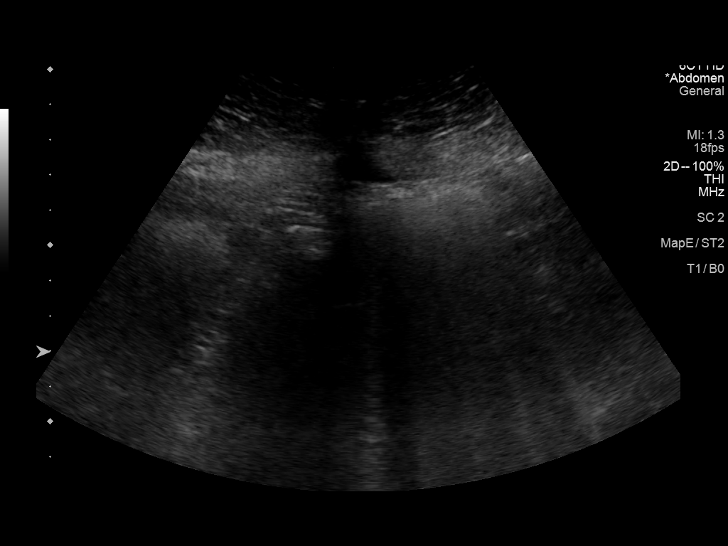

[14 of 25 positions shown; findings below may reference images not displayed]

FINDINGS: Gallbladder: No gallstones or wall thickening visualized. No
sonographic Murphy sign noted by sonographer.

Common bile duct: Diameter: 4 mm, normal

Liver: No focal lesion identified. Within normal limits in
parenchymal echogenicity. Portal vein is patent on color Doppler
imaging with normal direction of blood flow towards the liver.

IVC: No abnormality visualized.

Pancreas: Visualized portion unremarkable. Portions of the body and
tail are obscured by overlying bowel gas.

Spleen: Size and appearance within normal limits.

Right Kidney: Length: 9.8 cm. Echogenicity within normal limits. No
mass or hydronephrosis visualized.

Left Kidney: Limited visualization secondary to sonographic windows.
Length: 10.1 cm. Echogenicity within normal limits. No mass or
hydronephrosis visualized.

Abdominal aorta: No aneurysm visualized.

Other findings: None.
IMPRESSION: 1. No suspicious hepatic lesion identified within the limitations of
this exam.

## 2023-05-08 ENCOUNTER — Encounter: Payer: Self-pay | Admitting: Gastroenterology

## 2023-05-08 ENCOUNTER — Other Ambulatory Visit: Payer: Self-pay | Admitting: Gastroenterology

## 2023-05-08 DIAGNOSIS — B181 Chronic viral hepatitis B without delta-agent: Secondary | ICD-10-CM

## 2023-05-15 ENCOUNTER — Inpatient Hospital Stay
Admission: RE | Admit: 2023-05-15 | Discharge: 2023-05-15 | Discharge status: Home / Self Care | Payer: Medicare HMO | Source: Ambulatory Visit | Attending: Gastroenterology

## 2023-05-15 DIAGNOSIS — B181 Chronic viral hepatitis B without delta-agent: Secondary | ICD-10-CM

## 2023-11-30 DIAGNOSIS — I10 Essential (primary) hypertension: Secondary | ICD-10-CM | POA: Diagnosis not present

## 2023-11-30 DIAGNOSIS — E7800 Pure hypercholesterolemia, unspecified: Secondary | ICD-10-CM | POA: Diagnosis not present

## 2023-11-30 DIAGNOSIS — D7589 Other specified diseases of blood and blood-forming organs: Secondary | ICD-10-CM | POA: Diagnosis not present

## 2023-11-30 DIAGNOSIS — E538 Deficiency of other specified B group vitamins: Secondary | ICD-10-CM | POA: Diagnosis not present

## 2024-01-10 ENCOUNTER — Encounter: Payer: Self-pay | Admitting: Oncology

## 2024-01-10 ENCOUNTER — Other Ambulatory Visit

## 2024-01-10 ENCOUNTER — Inpatient Hospital Stay: Payer: Medicare (Managed Care)

## 2024-01-10 ENCOUNTER — Inpatient Hospital Stay: Payer: Medicare (Managed Care) | Attending: Oncology | Admitting: Oncology

## 2024-01-10 VITALS — BP 164/85 | HR 72 | Temp 97.8°F | Resp 18 | Ht 66.5 in | Wt 157.0 lb

## 2024-01-10 DIAGNOSIS — G4700 Insomnia, unspecified: Secondary | ICD-10-CM | POA: Insufficient documentation

## 2024-01-10 DIAGNOSIS — D7589 Other specified diseases of blood and blood-forming organs: Secondary | ICD-10-CM | POA: Insufficient documentation

## 2024-01-10 DIAGNOSIS — F1010 Alcohol abuse, uncomplicated: Secondary | ICD-10-CM | POA: Diagnosis not present

## 2024-01-10 DIAGNOSIS — F32A Depression, unspecified: Secondary | ICD-10-CM | POA: Diagnosis not present

## 2024-01-10 DIAGNOSIS — E785 Hyperlipidemia, unspecified: Secondary | ICD-10-CM | POA: Diagnosis not present

## 2024-01-10 DIAGNOSIS — F1090 Alcohol use, unspecified, uncomplicated: Secondary | ICD-10-CM | POA: Diagnosis not present

## 2024-01-10 DIAGNOSIS — B181 Chronic viral hepatitis B without delta-agent: Secondary | ICD-10-CM | POA: Diagnosis not present

## 2024-01-10 DIAGNOSIS — M129 Arthropathy, unspecified: Secondary | ICD-10-CM | POA: Diagnosis not present

## 2024-01-10 DIAGNOSIS — I10 Essential (primary) hypertension: Secondary | ICD-10-CM | POA: Insufficient documentation

## 2024-01-10 DIAGNOSIS — Z79899 Other long term (current) drug therapy: Secondary | ICD-10-CM | POA: Diagnosis not present

## 2024-01-10 LAB — CBC WITH DIFFERENTIAL (CANCER CENTER ONLY)
Abs Immature Granulocytes: 0.01 10*3/uL (ref 0.00–0.07)
Basophils Absolute: 0 10*3/uL (ref 0.0–0.1)
Basophils Relative: 1 %
Eosinophils Absolute: 0.1 10*3/uL (ref 0.0–0.5)
Eosinophils Relative: 2 %
HCT: 42.2 % (ref 36.0–46.0)
Hemoglobin: 14.2 g/dL (ref 12.0–15.0)
Immature Granulocytes: 0 %
Lymphocytes Relative: 33 %
Lymphs Abs: 1.5 10*3/uL (ref 0.7–4.0)
MCH: 33.7 pg (ref 26.0–34.0)
MCHC: 33.6 g/dL (ref 30.0–36.0)
MCV: 100.2 fL — ABNORMAL HIGH (ref 80.0–100.0)
Monocytes Absolute: 0.3 10*3/uL (ref 0.1–1.0)
Monocytes Relative: 7 %
Neutro Abs: 2.6 10*3/uL (ref 1.7–7.7)
Neutrophils Relative %: 57 %
Platelet Count: 230 10*3/uL (ref 150–400)
RBC: 4.21 MIL/uL (ref 3.87–5.11)
RDW: 12 % (ref 11.5–15.5)
WBC Count: 4.6 10*3/uL (ref 4.0–10.5)
nRBC: 0 % (ref 0.0–0.2)

## 2024-01-10 LAB — CMP (CANCER CENTER ONLY)
ALT: 12 U/L (ref 0–44)
AST: 24 U/L (ref 15–41)
Albumin: 4.1 g/dL (ref 3.5–5.0)
Alkaline Phosphatase: 61 U/L (ref 38–126)
Anion gap: 5 (ref 5–15)
BUN: 10 mg/dL (ref 8–23)
CO2: 31 mmol/L (ref 22–32)
Calcium: 9.1 mg/dL (ref 8.9–10.3)
Chloride: 105 mmol/L (ref 98–111)
Creatinine: 0.6 mg/dL (ref 0.44–1.00)
GFR, Estimated: >60 mL/min (ref >60)
Glucose, Bld: 90 mg/dL (ref 70–99)
Potassium: 4.1 mmol/L (ref 3.5–5.1)
Sodium: 141 mmol/L (ref 135–145)
Total Bilirubin: 0.7 mg/dL (ref 0.0–1.2)
Total Protein: 7.3 g/dL (ref 6.5–8.1)

## 2024-01-10 LAB — RETICULOCYTES
Immature Retic Fract: 12.6 % (ref 2.3–15.9)
RBC.: 4.17 MIL/uL (ref 3.87–5.11)
Retic Count, Absolute: 60 10*3/uL (ref 19.0–186.0)
Retic Ct Pct: 1.4 % (ref 0.4–3.1)

## 2024-01-10 LAB — DIRECT ANTIGLOBULIN TEST (NOT AT ARMC)
DAT, IgG: NEGATIVE
DAT, complement: NEGATIVE

## 2024-01-10 LAB — IRON AND IRON BINDING CAPACITY (CC-WL,HP ONLY)
Iron: 93 ug/dL (ref 28–170)
Saturation Ratios: 29 % (ref 10.4–31.8)
TIBC: 321 ug/dL (ref 250–450)
UIBC: 228 ug/dL (ref 148–442)

## 2024-01-10 LAB — FERRITIN: Ferritin: 103 ng/mL (ref 11–307)

## 2024-01-10 LAB — LACTATE DEHYDROGENASE: LDH: 125 U/L (ref 98–192)

## 2024-01-10 LAB — TSH: TSH: 2.47 u[IU]/mL (ref 0.350–4.500)

## 2024-01-10 NOTE — Progress Notes (Signed)
 [facility]  HEMATOLOGY CLINIC CONSULTATION NOTE   PATIENT NAME: Sandra Mcbride   MR#: [id number] DOB: 10-16-1945  DATE OF SERVICE: 01/10/2024   REFERRING PROVIDER  Dorena Gander, MD   Patient Care Team: Dorena Gander, MD as PCP - General (Family Medicine)   REASON FOR CONSULTATION/ CHIEF COMPLAINT: Macrocytosis   ASSESSMENT & PLAN:  Sandra Mcbride is a 78 y.o. lady with a past medical history of hypertension, dyslipidemia, chronic inactive hepatitis B, IBS, depression, alcohol use, was referred to our service for evaluation of macrocytosis.    Macrocytosis Macrocytosis with a mean corpuscular volume of 105 fL, slightly above the normal range of 80-100 fL. Previous labs in October and November showed similar results. Differential diagnosis includes vitamin B12 or folic acid  deficiency, medication effects, alcohol use, copper deficiency, improper B12 utilization, thyroid dysfunction, and bone marrow conditions.   Current B12 and folate levels are normal. No anemia, normal hemoglobin, white count, and platelet count.   Labs today actually showed near normal MCV at 100.2.  Unremarkable CBCD, CMP, iron studies, LDH, TSH.  Alcohol use is a likely contributing factor. No suspicion of bone marrow conditions due to normal blood counts.  We will check labs including serum copper, methylmalonic acid, reticulocyte count, TSH to rule out other possible etiologies for macrocytosis.  - Advise limiting alcohol intake to 1-2 drinks per day  - Schedule follow-up call in two weeks to discuss test results  - Plan follow-up visit in six months to repeat labs  Alcohol use Regular alcohol consumption, specifically wine, with two to three glasses per night. Alcohol use is a potential cause of macrocytosis. She uses wine for relaxation and as an alternative to depression medication. - Advise limiting alcohol intake to 1-2 drinks per day  General Health Maintenance Up to date on  mammograms and uses Cologuard for colon cancer screening. Next Cologuard test due next year.   I reviewed lab results and outside records for this visit and discussed relevant results with the patient. Diagnosis, plan of care and treatment options were also discussed in detail with the patient. Opportunity provided to ask questions and answers provided to her apparent satisfaction. Provided instructions to call our clinic with any problems, questions or concerns prior to return visit. I recommended to continue follow-up with PCP and sub-specialists. She verbalized understanding and agreed with the plan. No barriers to learning was detected.  Arlo Berber, MD  01/10/2024 2:06 PM  [facility] - A DEPT OF [facility] [address] [place] [address] Dept: [phone] Dept Fax: [phone]   HISTORY OF PRESENT ILLNESS:  Discussed the use of AI scribe software for clinical note transcription with the patient, who gave verbal consent to proceed.  History of Present Illness Sandra Mcbride is a 78 year old female who presents with macrocytosis. She was referred by Dr. Manual Self for evaluation of macrocytosis.  On 11/30/2023, labs at her PCPs office showed white count of 5600 with normal differential, hemoglobin 14.6, hematocrit 43.3, MCV 105.  Platelet count normal at 227,000.  Vitamin B12 was 1264 pg/mL.  Folic acid  was also normal.  Of note, she has been on B12 and folic acid  supplements as per her GI doctor.  Previously in November 2024, hemoglobin was 14.4, MCV was 102.3.  She was referred to us  for further evaluation of macrocytosis.  MCV slightly elevated at 105 fL, compared to the normal range of  80-100 fL. This finding is consistent with previous lab results from October and November of the previous year. She has no symptoms of anemia, and her hemoglobin levels have been normal.  She has a history of irritable bowel syndrome and  chronic hepatitis B. Previously, she was prescribed folic acid  and B12 supplements, which she discontinued after her levels were found to be normal. Her current medications include blood pressure and cholesterol medications, a daily multivitamin, and a probiotic. She does not take any other medications since stopping the folic acid  and B12 supplements.  She consumes two to three glasses of wine every night with dinner, which she finds relaxing and prefers over taking medication for her depression. She does not smoke and is up to date with her mammograms and colon cancer screening using Cologuard.    MEDICAL HISTORY Past Medical History:  Diagnosis Date   Arthritis    Hyperlipidemia    Hypertension      SURGICAL HISTORY Past Surgical History:  Procedure Laterality Date   ABDOMINAL HYSTERECTOMY  1980s   partial, cervix remains, for fibroids   APPENDECTOMY     EYE SURGERY     cataracts, both eye   JOINT REPLACEMENT N/A    [employer] 01/13/2020     SOCIAL HISTORY: She reports that she has never smoked. She has never used smokeless tobacco. She reports current alcohol use of about 20.0 standard drinks of alcohol per week. She reports that she does not use drugs. Social History   Socioeconomic History   Marital status: Widowed    Spouse name: Not on file   Number of children: Not on file   Years of education: Not on file   Highest education level: Not on file  Occupational History   Not on file  Tobacco Use   Smoking status: Never   Smokeless tobacco: Never  Substance and Sexual Activity   Alcohol use: Yes    Alcohol/week: 20.0 standard drinks of alcohol    Types: 20 Standard drinks or equivalent per week   Drug use: Never   Sexual activity: Not Currently  Other Topics Concern   Not on file  Social History Narrative   Not on file   Social Drivers of Health   Financial Resource Strain: Not on file  Food Insecurity: Food Insecurity Present (01/10/2024)   Hunger Vital Sign     Worried About Running Out of Food in the Last Year: Sometimes true    Ran Out of Food in the Last Year: Sometimes true  Transportation Needs: No Transportation Needs (01/10/2024)   PRAPARE - Administrator, Civil Service (Medical): No    Lack of Transportation (Non-Medical): No  Physical Activity: Not on file  Stress: Not on file  Social Connections: Not on file  Intimate Partner Violence: Not At Risk (01/10/2024)   Humiliation, Afraid, Rape, and Kick questionnaire    Fear of Current or Ex-Partner: No    Emotionally Abused: No    Physically Abused: No    Sexually Abused: No    FAMILY HISTORY: Her family history includes Heart disease in her brother, daughter, father, and mother; Hypertension in her brother, daughter, father, and mother; Intellectual disability in her son; Schizophrenia in her son.  CURRENT MEDICATIONS   Current Outpatient Medications  Medication Instructions   atorvastatin  (LIPITOR) 40 mg, Oral, Daily-1800   losartan  (COZAAR ) 100 MG tablet Take 1 tablet by mouth once daily     ALLERGIES  She has no known allergies.  REVIEW OF SYSTEMS:  Review of Systems - Oncology   Rest of the pertinent review of systems is unremarkable except as mentioned above in HPI.  PHYSICAL EXAMINATION:     Onc Performance Status - 01/10/24 0934       ECOG Perf Status   ECOG Perf Status Restricted in physically strenuous activity but ambulatory and able to carry out work of a light or sedentary nature, e.g., light house work, office work      KPS SCALE   KPS % SCORE Normal activity with effort, some s/s of disease          Vitals:   01/10/24 0921 01/10/24 0922  BP: (!) 174/86 (!) 164/85  Pulse: 72   Resp: 18   Temp: 97.8 F (36.6 C)   SpO2: 93%    Filed Weights   01/10/24 0921  Weight: 157 lb (71.2 kg)    Physical Exam Constitutional:      General: She is not in acute distress.    Appearance: Normal appearance.  HENT:     Head: Normocephalic  and atraumatic.   Cardiovascular:     Rate and Rhythm: Normal rate.     Heart sounds: Normal heart sounds.  Pulmonary:     Effort: Pulmonary effort is normal. No respiratory distress.     Breath sounds: Normal breath sounds.  Abdominal:     General: There is no distension.   Neurological:     General: No focal deficit present.     Mental Status: She is alert and oriented to person, place, and time.   Psychiatric:        Mood and Affect: Mood normal.        Behavior: Behavior normal.    LABORATORY DATA:   I have reviewed the data as listed.  Results for orders placed or performed in visit on 01/10/24  TSH  Result Value Ref Range   TSH 2.470 0.350 - 4.500 uIU/mL  Reticulocytes  Result Value Ref Range   Retic Ct Pct 1.4 0.4 - 3.1 %   RBC. 4.17 3.87 - 5.11 MIL/uL   Retic Count, Absolute 60.0 19.0 - 186.0 K/uL   Immature Retic Fract 12.6 2.3 - 15.9 %  Ferritin  Result Value Ref Range   Ferritin 103 11 - 307 ng/mL  Iron and Iron Binding Capacity ([facility] only)  Result Value Ref Range   Iron 93 28 - 170 ug/dL   TIBC 161 096 - 045 ug/dL   Saturation Ratios 29 10.4 - 31.8 %   UIBC 228 148 - 442 ug/dL  Lactate dehydrogenase  Result Value Ref Range   LDH 125 98 - 192 U/L  CMP (Cancer Center only)  Result Value Ref Range   Sodium 141 135 - 145 mmol/L   Potassium 4.1 3.5 - 5.1 mmol/L   Chloride 105 98 - 111 mmol/L   CO2 31 22 - 32 mmol/L   Glucose, Bld 90 70 - 99 mg/dL   BUN 10 8 - 23 mg/dL   Creatinine 4.09 8.11 - 1.00 mg/dL   Calcium  9.1 8.9 - 10.3 mg/dL   Total Protein 7.3 6.5 - 8.1 g/dL   Albumin 4.1 3.5 - 5.0 g/dL   AST 24 15 - 41 U/L   ALT 12 0 - 44 U/L   Alkaline Phosphatase 61 38 - 126 U/L   Total Bilirubin 0.7 0.0 - 1.2 mg/dL   GFR, Estimated >91 >47 mL/min   Anion gap 5 5 - 15  CBC with Differential (Cancer Center Only)  Result Value Ref Range   WBC Count 4.6 4.0 - 10.5 K/uL   RBC 4.21 3.87 - 5.11 MIL/uL   Hemoglobin 14.2 12.0 - 15.0 g/dL   HCT 78.2  95.6 - 21.3 %   MCV 100.2 (H) 80.0 - 100.0 fL   MCH 33.7 26.0 - 34.0 pg   MCHC 33.6 30.0 - 36.0 g/dL   RDW 08.6 57.8 - 46.9 %   Platelet Count 230 150 - 400 K/uL   nRBC 0.0 0.0 - 0.2 %   Neutrophils Relative % 57 %   Neutro Abs 2.6 1.7 - 7.7 K/uL   Lymphocytes Relative 33 %   Lymphs Abs 1.5 0.7 - 4.0 K/uL   Monocytes Relative 7 %   Monocytes Absolute 0.3 0.1 - 1.0 K/uL   Eosinophils Relative 2 %   Eosinophils Absolute 0.1 0.0 - 0.5 K/uL   Basophils Relative 1 %   Basophils Absolute 0.0 0.0 - 0.1 K/uL   Immature Granulocytes 0 %   Abs Immature Granulocytes 0.01 0.00 - 0.07 K/uL  Direct antiglobulin test (not at [facility])  Result Value Ref Range   DAT, complement NEG    DAT, IgG      NEG Performed at [facility], [address], [place], [place] [address]      RADIOGRAPHIC STUDIES:  No pertinent imaging studies available to review.  Orders Placed This Encounter  Procedures   CBC with Differential (Cancer Center Only)    Standing Status:   Future    Number of Occurrences:   1    Expiration Date:   01/09/2025   CMP (Cancer Center only)    Standing Status:   Future    Number of Occurrences:   1    Expiration Date:   01/09/2025   Lactate dehydrogenase    Standing Status:   Future    Number of Occurrences:   1    Expiration Date:   01/09/2025   Iron and Iron Binding Capacity ([facility] only)    Standing Status:   Future    Number of Occurrences:   1    Expiration Date:   01/09/2025   Ferritin    Standing Status:   Future    Number of Occurrences:   1    Expiration Date:   01/09/2025   Copper, serum    Standing Status:   Future    Number of Occurrences:   1    Expiration Date:   01/09/2025   Methylmalonic acid, serum    Standing Status:   Future    Number of Occurrences:   1    Expiration Date:   01/09/2025   Reticulocytes    Standing Status:   Future    Number of Occurrences:   1    Expiration Date:   01/09/2025   TSH    Standing Status:   Future     Number of Occurrences:   1    Expiration Date:   01/09/2025   Direct antiglobulin test (not at [facility])    Standing Status:   Future    Number of Occurrences:   1    Expiration Date:   01/09/2025    Future Appointments  Date Time Provider Department Center  01/24/2024 11:30 AM Klara Stjames, Gale Jude, MD [facility] None  07/11/2024  2:30 PM [facility] LAB [facility] None  07/11/2024  3:00 PM Missael Ferrari, MD [facility] None    I spent a total  of 40 minutes during this encounter with the patient including review of chart and various tests results, discussions about plan of care and coordination of care plan.  This document was completed utilizing speech recognition software. Grammatical errors, random word insertions, pronoun errors, and incomplete sentences are an occasional consequence of this system due to software limitations, ambient noise, and hardware issues. Any formal questions or concerns about the content, text or information contained within the body of this dictation should be directly addressed to the provider for clarification.

## 2024-01-10 NOTE — Assessment & Plan Note (Addendum)
 Macrocytosis with a mean corpuscular volume of 105 fL, slightly above the normal range of 80-100 fL. Previous labs in October and November showed similar results. Differential diagnosis includes vitamin B12 or folic acid  deficiency, medication effects, alcohol use, copper deficiency, improper B12 utilization, thyroid dysfunction, and bone marrow conditions.   Current B12 and folate levels are normal. No anemia, normal hemoglobin, white count, and platelet count.   Labs today actually showed near normal MCV at 100.2.  Unremarkable CBCD, CMP, iron studies, LDH, TSH.  Alcohol use is a likely contributing factor. No suspicion of bone marrow conditions due to normal blood counts.  We will check labs including serum copper, methylmalonic acid, reticulocyte count, TSH to rule out other possible etiologies for macrocytosis.  - Advise limiting alcohol intake to 1-2 drinks per day  - Schedule follow-up call in two weeks to discuss test results  - Plan follow-up visit in six months to repeat labs

## 2024-01-10 NOTE — Assessment & Plan Note (Signed)
 Regular alcohol consumption, specifically wine, with two to three glasses per night. Alcohol use is a potential cause of macrocytosis. She uses wine for relaxation and as an alternative to depression medication. - Advise limiting alcohol intake to 1-2 drinks per day

## 2024-01-10 NOTE — Progress Notes (Signed)
 Patient is not a harm to self or anyone else per her report. Has lost two children since 2012. And lost mother in 2019. Patient reports her depression has improved.

## 2024-01-13 LAB — METHYLMALONIC ACID, SERUM: Methylmalonic Acid, Quantitative: 134 nmol/L (ref 0–378)

## 2024-01-14 LAB — COPPER, SERUM: Copper: 107 ug/dL (ref 80–158)

## 2024-01-24 ENCOUNTER — Inpatient Hospital Stay: Payer: Medicare (Managed Care) | Attending: Oncology | Admitting: Oncology

## 2024-01-24 ENCOUNTER — Encounter: Payer: Self-pay | Admitting: Oncology

## 2024-01-24 DIAGNOSIS — D7589 Other specified diseases of blood and blood-forming organs: Secondary | ICD-10-CM | POA: Diagnosis not present

## 2024-01-24 DIAGNOSIS — F1090 Alcohol use, unspecified, uncomplicated: Secondary | ICD-10-CM

## 2024-01-24 NOTE — Assessment & Plan Note (Signed)
 Regular alcohol consumption, specifically wine, with two to three glasses per night. Alcohol use is a potential cause of macrocytosis. She uses wine for relaxation and as an alternative to depression medication. - Advise limiting alcohol intake to 1-2 drinks per day

## 2024-01-24 NOTE — Progress Notes (Signed)
 [facility]  HEMATOLOGY-ONCOLOGY ELECTRONIC VISIT PROGRESS NOTE  PATIENT NAME: Sandra Mcbride   MR#: [id number] DOB: May 11, 1946  DATE OF SERVICE: 01/24/2024  Patient Care Team: Rolinda Millman, MD as PCP - General (Family Medicine)  I connected with the patient via telephone conference and verified that I am speaking with the correct person using two identifiers. The patient's location is at home and I am providing care from the [facility].  I discussed the limitations, risks, security and privacy concerns of performing an evaluation and management service by e-visits and the availability of in person appointments. I also discussed with the patient that there may be a patient responsible charge related to this service. The patient expressed understanding and agreed to proceed.   ASSESSMENT & PLAN:   Sandra Mcbride is a 78 y.o. lady with a past medical history of hypertension, dyslipidemia, chronic inactive hepatitis B, IBS, depression, alcohol use, was referred to our service for evaluation of macrocytosis.   Macrocytosis Macrocytosis with a mean corpuscular volume of 105 fL, slightly above the normal range of 80-100 fL. Previous labs in October and November showed similar results.   Differential diagnosis included vitamin B12 or folic acid  deficiency, medication effects, alcohol use, copper  deficiency, improper B12 utilization, thyroid dysfunction, and bone marrow conditions.   Current B12 and folate levels were normal. No anemia, normal hemoglobin, white count, and platelet count.   On her consultation with us  on 01/10/2024, labs actually showed near normal MCV at 100.2.  Unremarkable CBCD, CMP, iron studies, LDH, TSH.  Methylmalonic acid, serum copper , TSH were all within normal limits.  Alcohol use is a likely contributing factor. No suspicion of bone marrow conditions due to normal blood counts.  - Advise limiting alcohol intake to 1-2 drinks per day  - Plan  follow-up visit in six months to repeat labs  Alcohol use Regular alcohol consumption, specifically wine, with two to three glasses per night. Alcohol use is a potential cause of macrocytosis. She uses wine for relaxation and as an alternative to depression medication. - Advise limiting alcohol intake to 1-2 drinks per day   I discussed the assessment and treatment plan with the patient. The patient was provided an opportunity to ask questions and all were answered. The patient agreed with the plan and demonstrated an understanding of the instructions. The patient was advised to call back or seek an in-person evaluation if the symptoms worsen or if the condition fails to improve as anticipated.    I spent 11 minutes over the phone with the patient reviewing test results, discuss management and coordination/planning of care.  Sandra Patten, MD 01/24/2024 3:13 PM [facility] - A DEPT OF [facility] [address] [place] [address] Dept: [phone] Dept Fax: [phone]   INTERVAL HISTORY:  Please see above for problem oriented charting.  The purpose of today's discussion is to explain recent lab results and to formulate plan of care.  Discussed the use of AI scribe software for clinical note transcription with the patient, who gave verbal consent to proceed.  History of Present Illness Sandra Mcbride is a 78 year old female who presents for follow-up of macrocytosis. She was referred by another provider for evaluation of macrocytosis.  She underwent a workup for macrocytosis, which initially showed a mean corpuscular volume (MCV) of 105 fL. Upon re-evaluation, the MCV was 100.2 fL, indicating an improvement. Her blood counts, including white  blood cell count, red blood cell count, and platelet count, were all within normal limits. Additional tests to assess for autoimmune hemolysis and vitamin B12 utilization were  conducted and returned normal results.  She has a history of chronic hepatitis B. There is also a mention of alcohol use.     SUMMARY OF HEMATOLOGY HISTORY:  On 11/30/2023, labs at her PCPs office showed white count of 5600 with normal differential, hemoglobin 14.6, hematocrit 43.3, MCV 105.  Platelet count normal at 227,000.  Vitamin B12 was 1264 pg/mL.  Folic acid  was also normal.  Of note, she has been on B12 and folic acid  supplements as per her GI doctor.  Previously in November 2024, hemoglobin was 14.4, MCV was 102.3.  She was referred to us  for further evaluation of macrocytosis.   MCV slightly elevated at 105 fL, compared to the normal range of 80-100 fL. This finding is consistent with previous lab results from October and November of the previous year. She has no symptoms of anemia, and her hemoglobin levels have been normal.   She has a history of irritable bowel syndrome and chronic hepatitis B. Previously, she was prescribed folic acid  and B12 supplements, which she discontinued after her levels were found to be normal. Her current medications include blood pressure and cholesterol medications, a daily multivitamin, and a probiotic. She does not take any other medications since stopping the folic acid  and B12 supplements.   She consumes two to three glasses of wine every night with dinner, which she finds relaxing and prefers over taking medication for her depression. She does not smoke and is up to date with her mammograms and colon cancer screening using [employer].  Differential diagnosis included vitamin B12 or folic acid  deficiency, medication effects, alcohol use, copper  deficiency, improper B12 utilization, thyroid dysfunction, and bone marrow conditions.   Current B12 and folate levels were normal. No anemia, normal hemoglobin, white count, and platelet count.   On her consultation with us  on 01/10/2024, labs actually showed near normal MCV at 100.2.  Unremarkable CBCD, CMP,  iron studies, LDH, TSH.  Methylmalonic acid, serum copper , TSH were all within normal limits.  Alcohol use is a likely contributing factor. No suspicion of bone marrow conditions due to normal blood counts.  - Advise limiting alcohol intake to 1-2 drinks per day  REVIEW OF SYSTEMS:    Review of Systems - Oncology  All other pertinent systems were reviewed with the patient and are negative.  I have reviewed the past medical history, past surgical history, social history and family history with the patient and they are unchanged from previous note.  ALLERGIES:  She has no known allergies.  MEDICATIONS:  Current Outpatient Medications  Medication Sig Dispense Refill   atorvastatin  (LIPITOR) 40 MG tablet Take 1 tablet (40 mg total) by mouth daily at 6 PM. 90 tablet 3   losartan  (COZAAR ) 100 MG tablet Take 1 tablet by mouth once daily 90 tablet 0   No current facility-administered medications for this visit.    PHYSICAL EXAMINATION:    Onc Performance Status - 01/24/24 1500       ECOG Perf Status   ECOG Perf Status Restricted in physically strenuous activity but ambulatory and able to carry out work of a light or sedentary nature, e.g., light house work, office work      KPS SCALE   KPS % SCORE Normal activity with effort, some s/s of disease  LABORATORY DATA:   I have reviewed the data as listed.  Recent Results (from the past 2160 hours)  Direct antiglobulin test (not at [facility])     Status: None   Collection Time: 01/10/24 10:29 AM  Result Value Ref Range   DAT, complement NEG    DAT, IgG      NEG Performed at [facility] [address], [place], [place] [address]   TSH     Status: None   Collection Time: 01/10/24 10:29 AM  Result Value Ref Range   TSH 2.470 0.350 - 4.500 uIU/mL    Comment: Performed at [facility], [address], [place], [place] [address]  Reticulocytes     Status: None   Collection Time:  01/10/24 10:29 AM  Result Value Ref Range   Retic Ct Pct 1.4 0.4 - 3.1 %   RBC. 4.17 3.87 - 5.11 MIL/uL   Retic Count, Absolute 60.0 19.0 - 186.0 K/uL   Immature Retic Fract 12.6 2.3 - 15.9 %    Comment: Performed at [facility], [address], [place], [place] [address]  Methylmalonic acid, serum     Status: None   Collection Time: 01/10/24 10:29 AM  Result Value Ref Range   Methylmalonic Acid, Quantitative 134 0 - 378 nmol/L    Comment: (NOTE) This test was developed and its performance characteristics determined by [facility]. It has not been cleared or approved by the Food and Drug Administration. Performed At: [facility] [address] [place], [place] [id number] Jennette Shorter MD Ey:[id number]   Copper , serum     Status: None   Collection Time: 01/10/24 10:29 AM  Result Value Ref Range   Copper  107 80 - 158 ug/dL    Comment: (NOTE) This test was developed and its performance characteristics determined by [facility]. It has not been cleared or approved by the Food and Drug Administration.                                Detection Limit = 5 Performed At: [facility] [address] [place], [place] [id number] Jennette Shorter MD Ey:[id number]   Iron and Iron Binding Capacity ([facility] only)     Status: None   Collection Time: 01/10/24 10:29 AM  Result Value Ref Range   Iron 93 28 - 170 ug/dL   TIBC 678 749 - 549 ug/dL   Saturation Ratios 29 10.4 - 31.8 %   UIBC 228 148 - 442 ug/dL    Comment: Performed at [facility], [address], [place], [place] [address]  CMP (Cancer Center only)     Status: None   Collection Time: 01/10/24 10:29 AM  Result Value Ref Range   Sodium 141 135 - 145 mmol/L   Potassium 4.1 3.5 - 5.1 mmol/L   Chloride 105 98 - 111 mmol/L   CO2 31 22 - 32 mmol/L   Glucose, Bld 90 70 - 99 mg/dL    Comment: Glucose reference range applies only to samples taken after fasting for at least 8  hours.   BUN 10 8 - 23 mg/dL   Creatinine 9.39 9.55 - 1.00 mg/dL   Calcium  9.1 8.9 - 10.3 mg/dL   Total Protein 7.3 6.5 - 8.1 g/dL   Albumin 4.1 3.5 - 5.0 g/dL   AST 24 15 - 41 U/L   ALT 12 0 - 44 U/L  Alkaline Phosphatase 61 38 - 126 U/L   Total Bilirubin 0.7 0.0 - 1.2 mg/dL   GFR, Estimated >39 >39 mL/min    Comment: (NOTE) Calculated using the CKD-EPI Creatinine Equation (2021)    Anion gap 5 5 - 15    Comment: Performed at [facility], [address], [place], [place] [address]  CBC with Differential (Cancer Center Only)     Status: Abnormal   Collection Time: 01/10/24 10:29 AM  Result Value Ref Range   WBC Count 4.6 4.0 - 10.5 K/uL   RBC 4.21 3.87 - 5.11 MIL/uL   Hemoglobin 14.2 12.0 - 15.0 g/dL   HCT 57.7 63.9 - 53.9 %   MCV 100.2 (H) 80.0 - 100.0 fL   MCH 33.7 26.0 - 34.0 pg   MCHC 33.6 30.0 - 36.0 g/dL   RDW 87.9 88.4 - 84.4 %   Platelet Count 230 150 - 400 K/uL   nRBC 0.0 0.0 - 0.2 %   Neutrophils Relative % 57 %   Neutro Abs 2.6 1.7 - 7.7 K/uL   Lymphocytes Relative 33 %   Lymphs Abs 1.5 0.7 - 4.0 K/uL   Monocytes Relative 7 %   Monocytes Absolute 0.3 0.1 - 1.0 K/uL   Eosinophils Relative 2 %   Eosinophils Absolute 0.1 0.0 - 0.5 K/uL   Basophils Relative 1 %   Basophils Absolute 0.0 0.0 - 0.1 K/uL   Immature Granulocytes 0 %   Abs Immature Granulocytes 0.01 0.00 - 0.07 K/uL    Comment: Performed at [facility], [address] [place], [place] [address]  Ferritin     Status: None   Collection Time: 01/10/24 10:30 AM  Result Value Ref Range   Ferritin 103 11 - 307 ng/mL    Comment: Performed at [facility], [address], [place], [place] [address]  Lactate dehydrogenase     Status: None   Collection Time: 01/10/24 10:30 AM  Result Value Ref Range   LDH 125 98 - 192 U/L    Comment: Performed at [facility], [address], [place], [place] [address]      RADIOGRAPHIC STUDIES:  No recent pertinent imaging studies available to review.  Orders Placed This Encounter  Procedures   CBC with Differential (Cancer Center Only)    Standing Status:   Future    Expected Date:   07/26/2024    Expiration Date:   10/24/2024   CMP (Cancer Center only)    Standing Status:   Future    Expected Date:   07/26/2024    Expiration Date:   10/24/2024   Vitamin B12    Standing Status:   Future    Expected Date:   07/26/2024    Expiration Date:   10/24/2024   Folate    Standing Status:   Future    Expected Date:   07/26/2024    Expiration Date:   10/24/2024     Future Appointments  Date Time Provider Department Center  07/11/2024  2:30 PM [facility] None  07/11/2024  3:00 PM Dema Timmons, Chinita, MD [facility] None    This document was completed utilizing speech recognition software. Grammatical errors, random word insertions, pronoun errors, and incomplete sentences are an occasional consequence of this system due to software limitations, ambient noise, and hardware issues. Any formal questions or concerns about the content, text or information contained within the body of this dictation should be directly  addressed to the provider for clarification.

## 2024-01-24 NOTE — Assessment & Plan Note (Addendum)
 Macrocytosis with a mean corpuscular volume of 105 fL, slightly above the normal range of 80-100 fL. Previous labs in October and November showed similar results.   Differential diagnosis included vitamin B12 or folic acid  deficiency, medication effects, alcohol use, copper  deficiency, improper B12 utilization, thyroid dysfunction, and bone marrow conditions.   Current B12 and folate levels were normal. No anemia, normal hemoglobin, white count, and platelet count.   On her consultation with us  on 01/10/2024, labs actually showed near normal MCV at 100.2.  Unremarkable CBCD, CMP, iron studies, LDH, TSH.  Methylmalonic acid, serum copper , TSH were all within normal limits.  Alcohol use is a likely contributing factor. No suspicion of bone marrow conditions due to normal blood counts.  - Advise limiting alcohol intake to 1-2 drinks per day  - Plan follow-up visit in six months to repeat labs

## 2024-02-06 ENCOUNTER — Other Ambulatory Visit: Payer: Self-pay | Admitting: Orthopaedic Surgery

## 2024-02-06 DIAGNOSIS — G8929 Other chronic pain: Secondary | ICD-10-CM

## 2024-02-08 ENCOUNTER — Ambulatory Visit
Admission: RE | Admit: 2024-02-08 | Discharge: 2024-02-08 | Disposition: A | Discharge status: Home / Self Care | Payer: Medicare (Managed Care) | Source: Ambulatory Visit | Attending: Orthopaedic Surgery | Admitting: Orthopaedic Surgery

## 2024-02-08 DIAGNOSIS — G8929 Other chronic pain: Secondary | ICD-10-CM

## 2024-05-22 ENCOUNTER — Other Ambulatory Visit: Payer: Self-pay | Admitting: Gastroenterology

## 2024-05-22 DIAGNOSIS — B181 Chronic viral hepatitis B without delta-agent: Secondary | ICD-10-CM

## 2024-05-27 NOTE — Progress Notes (Unsigned)
 Cardiology Clinic Note   Patient Name: Sandra Mcbride Date of Encounter: 05/29/2024  Primary Care Provider:  Rolinda Millman, MD Primary Cardiologist:  None  Patient Profile    Sandra Mcbride 78 year old female presents to the clinic today for evaluation of her dizziness, palpitations and feeling faint.  Past Medical History    Past Medical History:  Diagnosis Date   Arthritis    Hyperlipidemia    Hypertension    Past Surgical History:  Procedure Laterality Date   ABDOMINAL HYSTERECTOMY  1980s   partial, cervix remains, for fibroids   APPENDECTOMY     EYE SURGERY     cataracts, both eye   JOINT REPLACEMENT N/A    Phreesia 01/13/2020    Allergies  No Known Allergies  History of Present Illness    Sandra Mcbride has a PMH of hypertension, HLD, vertigo, depression, and hepatitis B.  She reports a family history of heart disease with her mother having died from MI and dementia.  She also reports that her father had heart disease but does not know what type.  Her oldest brother had dementia and her younger brother seen in our clinic for atrial fibrillation.  She was seen and evaluated by her PCP on 05/22/2024.  She underwent left shoulder replacement 9/25.  She continued to do physical therapy 2 times per week.  She noted that occasionally she would have a funny sensation in her stomach.  She would get lightheaded and have palpitations.  She felt faint.  She would eat peanut butter and crackers or drink orange juice.  She felt that her heart would settle down.  Her blood pressure was normal.  Her heart rate was noted to be normal on exam.  Her respirations were unlabored.  she was referred to cardiology for further evaluation.  She presents to the clinic today for evaluation and states she had not noticed any symptoms until she had left shoulder surgery.  At that time she was prescribed oxycodone and Celebrex.  She reports that she has not taken the medications x 1 month.  She  notices brief episodes of dizziness and palpitations.  She reports that her episodes feel better/dissipate after she eats.  The episodes typically happen later in the day.  She has not noted episodes in the last several days.  She also reports that she has not been hydrating well.  I will order 30-day cardiac event monitor, CBC, BMP, magnesium, and have her follow-up after testing.  Today she denies chest pain, shortness of breath, lower extremity edema, fatigue, palpitations, melena, hematuria, hemoptysis, diaphoresis, weakness, presyncope, syncope, orthopnea, and PND.    Home Medications    Prior to Admission medications   Medication Sig Start Date End Date Taking? Authorizing Provider  atorvastatin  (LIPITOR) 40 MG tablet Take 1 tablet (40 mg total) by mouth daily at 6 PM. 07/13/20   Sagardia, Miguel Jose, MD  losartan  (COZAAR ) 100 MG tablet Take 1 tablet by mouth once daily 03/06/21   Sagardia, Miguel Jose, MD    Family History    Family History  Problem Relation Age of Onset   Heart disease Mother    Hypertension Mother    Heart disease Father    Hypertension Father    Heart disease Brother    Hypertension Brother    Heart disease Daughter    Hypertension Daughter    Schizophrenia Son    Intellectual disability Son    She indicated that her mother is deceased. She indicated  that her father is deceased. She indicated that her sister is alive. She indicated that her brother is alive. She indicated that the status of her daughter is unknown. She indicated that her son is alive.  Social History    Social History   Socioeconomic History   Marital status: Widowed    Spouse name: Not on file   Number of children: Not on file   Years of education: Not on file   Highest education level: Not on file  Occupational History   Not on file  Tobacco Use   Smoking status: Never   Smokeless tobacco: Never  Substance and Sexual Activity   Alcohol use: Yes    Alcohol/week: 20.0  standard drinks of alcohol    Types: 20 Standard drinks or equivalent per week   Drug use: Never   Sexual activity: Not Currently  Other Topics Concern   Not on file  Social History Narrative   Not on file   Social Drivers of Health   Financial Resource Strain: Not on file  Food Insecurity: Food Insecurity Present (01/10/2024)   Hunger Vital Sign    Worried About Running Out of Food in the Last Year: Sometimes true    Ran Out of Food in the Last Year: Sometimes true  Transportation Needs: No Transportation Needs (01/10/2024)   PRAPARE - Administrator, Civil Service (Medical): No    Lack of Transportation (Non-Medical): No  Physical Activity: Not on file  Stress: Not on file  Social Connections: Not on file  Intimate Partner Violence: Not At Risk (01/10/2024)   Humiliation, Afraid, Rape, and Kick questionnaire    Fear of Current or Ex-Partner: No    Emotionally Abused: No    Physically Abused: No    Sexually Abused: No     Review of Systems    General:  No chills, fever, night sweats or weight changes.  Cardiovascular:  No chest pain, dyspnea on exertion, edema, orthopnea, palpitations, paroxysmal nocturnal dyspnea. Dermatological: No rash, lesions/masses Respiratory: No cough, dyspnea Urologic: No hematuria, dysuria Abdominal:   No nausea, vomiting, diarrhea, bright red blood per rectum, melena, or hematemesis Neurologic:  No visual changes, wkns, changes in mental status. All other systems reviewed and are otherwise negative except as noted above.  Physical Exam    VS:  BP 124/78   Pulse 84   Ht 5' 6.5 (1.689 m)   Wt 149 lb (67.6 kg)   SpO2 97%   BMI 23.69 kg/m  , BMI Body mass index is 23.69 kg/m. GEN: Well nourished, well developed, in no acute distress. HEENT: normal. Neck: Supple, no JVD, carotid bruits, or masses. Cardiac: RRR, no murmurs, rubs, or gallops. No clubbing, cyanosis, edema.  Radials/DP/PT 2+ and equal bilaterally.  Respiratory:   Respirations regular and unlabored, clear to auscultation bilaterally. GI: Soft, nontender, nondistended, BS + x 4. MS: no deformity or atrophy. Skin: warm and dry, no rash. Neuro:  Strength and sensation are intact. Psych: Normal affect.  Accessory Clinical Findings    Recent Labs: 01/10/2024: ALT 12; BUN 10; Creatinine 0.60; Hemoglobin 14.2; Platelet Count 230; Potassium 4.1; Sodium 141; TSH 2.470   Recent Lipid Panel    Component Value Date/Time   CHOL 169 09/27/2020 1402   TRIG 66 09/27/2020 1402   HDL 75 09/27/2020 1402   CHOLHDL 2.3 09/27/2020 1402   LDLCALC 81 09/27/2020 1402         ECG personally reviewed by me today- EKG Interpretation Date/Time:  Thursday May 29 2024 14:02:47 EST Ventricular Rate:  84 PR Interval:  146 QRS Duration:  82 QT Interval:  378 QTC Calculation: 446 R Axis:   8  Text Interpretation: Normal sinus rhythm Nonspecific T wave abnormality When compared with ECG of 20-Feb-2020 14:51, No significant change was found Confirmed by Emelia Hazy [phone]) on 05/29/2024 2:41:19 PM         Assessment & Plan   1.  Palpitations, dizziness, lightheadedness-EKG today shows NSR 84 BPM.  Has noted various episodes that are brief and resolve with eating a snack or drinking orange juice.  Symptoms have been present since beginning of September.  During that time she underwent left shoulder replacement. CBC, BMP, magnesium 30-day cardiac event monitor Increase p.o. hydration Small regular meals  Essential hypertension-BP today 148/90 and 124/78. Maintain blood pressure log Heart healthy low-sodium diet Continue losartan   Hyperlipidemia-LDL 86 on11/7/24. High-fiber diet Continue atorvastatin   Disposition: Follow-up with Dr. Kriste or me in 6-10 weeks.   Hazy HERO. Ilana Prezioso NP-C     05/29/2024, 3:14 PM [facility] [address] [place], [place] [address] Office [phone]    Notice: This  dictation was prepared with Dragon dictation along with smaller phrase technology. Any transcriptional errors that result from this process are unintentional and may not be corrected upon review.   I spent 14 minutes examining this patient, reviewing medications, and using patient centered shared decision making involving their cardiac care.   I spent  20 minutes reviewing past medical history,  medications, and prior cardiac tests.

## 2024-05-29 ENCOUNTER — Encounter: Payer: Self-pay | Admitting: General Practice

## 2024-05-29 ENCOUNTER — Ambulatory Visit: Payer: Medicare (Managed Care) | Attending: General Practice | Admitting: General Practice

## 2024-05-29 VITALS — BP 124/78 | HR 84 | Ht 66.5 in | Wt 149.0 lb

## 2024-05-29 DIAGNOSIS — E782 Mixed hyperlipidemia: Secondary | ICD-10-CM

## 2024-05-29 DIAGNOSIS — R42 Dizziness and giddiness: Secondary | ICD-10-CM

## 2024-05-29 DIAGNOSIS — R002 Palpitations: Secondary | ICD-10-CM | POA: Diagnosis not present

## 2024-05-29 DIAGNOSIS — R55 Syncope and collapse: Secondary | ICD-10-CM | POA: Diagnosis not present

## 2024-05-29 NOTE — Patient Instructions (Signed)
 Medication Instructions:  Your physician recommends that you continue on your current medications as directed. Please refer to the Current Medication list given to you today.  *If you need a refill on your cardiac medications before your next appointment, please call your pharmacy*  Lab Work: TODAY:  BMET, CBC, & MAG  If you have labs (blood work) drawn today and your tests are completely normal, you will receive your results only by: MyChart Message (if you have MyChart) OR A paper copy in the mail If you have any lab test that is abnormal or we need to change your treatment, we will call you to review the results.  Testing/Procedures: [employer] Cardiac Event Monitor Instructions  Your physician has requested you wear your cardiac event monitor for 30 days, (1-30). [employer] may call or text to confirm a shipping address. The monitor will be sent to a land address via UPS. [employer] will not ship a monitor to a PO BOX. It typically takes 3-5 days to receive your monitor after it has been enrolled. [employer] will assist with USPS tracking if your package is delayed. The telephone number for [employer] is [phone]. Once you have received your monitor, please review the enclosed instructions. Instruction tutorials can also be viewed under help and settings on the enclosed cell phone. Your monitor has already been registered assigning a specific monitor serial # to you.  Billing and Self Pay Discount Information  [employer] has been provided the insurance information we had on file for you.  If your insurance has been updated, please call [employer] at [phone] to provide them with your updated insurance information.   [employer] offers a discounted Self Pay option for patients who have insurance that does not cover their cardiac event monitor or patients without insurance.  The discounted cost of a Self Pay Cardiac Event Monitor would be $225.00 , if the patient contacts  [employer] at [phone] within 7 days of applying the monitor to make payment arrangements.  If the patient does not contact [employer] within 7 days of applying the monitor, the cost of the cardiac event monitor will be $350.00.  Applying the monitor  Remove cell phone from case and turn it on. The cell phone works as it consultant and needs to be within [employer] of you at all times. The cell phone will need to be charged on a daily basis. We recommend you plug the cell phone into the enclosed charger at your bedside table every night.  Monitor batteries: You will receive two monitor batteries labelled #1 and #2. These are your recorders. Plug battery #2 onto the second connection on the enclosed charger. Keep one battery on the charger at all times. This will keep the monitor battery deactivated. It will also keep it fully charged for when you need to switch your monitor batteries. A small light will be blinking on the battery emblem when it is charging. The light on the battery emblem will remain on when the battery is fully charged.  Open package of a Monitor strip. Insert battery #1 into black hood on strip and gently squeeze monitor battery onto connection as indicated in instruction booklet. Set aside while preparing skin.  Choose location for your strip, vertical or horizontal, as indicated in the instruction booklet. Shave to remove all hair from location. There cannot be any lotions, oils, powders, or colognes on skin where monitor is to be applied. Wipe skin clean with enclosed Saline wipe. Dry skin completely.  Peel paper labeled #1  off the back of the Monitor strip exposing the adhesive. Place the monitor on the chest in the vertical or horizontal position shown in the instruction booklet. One arrow on the monitor strip must be pointing upward. Carefully remove paper labeled #2, attaching remainder of strip to your skin. Try not to create any folds or wrinkles in the strip  as you apply it.  Firmly press and release the circle in the center of the monitor battery. You will hear a small beep. This is turning the monitor battery on. The heart emblem on the monitor battery will light up every 5 seconds if the monitor battery in turned on and connected to the patient securely. Do not push and hold the circle down as this turns the monitor battery off. The cell phone will locate the monitor battery. A screen will appear on the cell phone checking the connection of your monitor strip. This may read poor connection initially but change to good connection within the next minute. Once your monitor accepts the connection you will hear a series of 3 beeps followed by a climbing crescendo of beeps. A screen will appear on the cell phone showing the two monitor strip placement options. Touch the picture that demonstrates where you applied the monitor strip.  Your monitor strip and battery are waterproof. You are able to shower, bathe, or swim with the monitor on. They just ask you do not submerge deeper than 3 feet underwater. We recommend removing the monitor if you are swimming in a lake, river, or ocean.  Your monitor battery will need to be switched to a fully charged monitor battery approximately once a week. The cell phone will alert you of an action which needs to be made.  On the cell phone, tap for details to reveal connection status, monitor battery status, and cell phone battery status. The green dots indicates your monitor is in good status. A red dot indicates there is something that needs your attention.  To record a symptom, click the circle on the monitor battery. In 30-60 seconds a list of symptoms will appear on the cell phone. Select your symptom and tap save. Your monitor will record a sustained or significant arrhythmia regardless of you clicking the button. Some patients do not feel the heart rhythm irregularities. [employer] will notify us  of any  serious or critical events.  Refer to instruction booklet for instructions on switching batteries, changing strips, the Do not disturb or Pause features, or any additional questions.  Call [employer] at [phone], to confirm your monitor is transmitting and record your baseline. They will answer any questions you may have regarding the monitor instructions at that time.  Returning the monitor to [employer]  Place all equipment back into blue box. Peel off strip of paper to expose adhesive and close box securely. There is a prepaid UPS shipping label on this box. Drop in a UPS drop box, or at a UPS facility like [employer]. You may also contact [employer] to arrange UPS to pick up monitor package at your home.   Follow-Up: At [facility], you and your health needs are our priority.  As part of our continuing mission to provide you with exceptional heart care, our providers are all part of one team.  This team includes your primary Cardiologist (physician) and Advanced Practice Providers or APPs (Physician Assistants and Nurse Practitioners) who all work together to provide you with the care you need, when you need it.  Your next appointment:  10 week(s)  Provider:   Josefa Beauvais, NP          We recommend signing up for the patient portal called MyChart.  Sign up information is provided on this After Visit Summary.  MyChart is used to connect with patients for Virtual Visits (Telemedicine).  Patients are able to view lab/test results, encounter notes, upcoming appointments, etc.  Non-urgent messages can be sent to your provider as well.   To learn more about what you can do with MyChart, go to [email or web address].   Other Instructions

## 2024-05-30 ENCOUNTER — Ambulatory Visit: Payer: Self-pay | Admitting: General Practice

## 2024-05-30 LAB — BASIC METABOLIC PANEL WITH GFR
BUN/Creatinine Ratio: 13 (ref 12–28)
BUN: 9 mg/dL (ref 8–27)
CO2: 27 mmol/L (ref 20–29)
Calcium: 9.6 mg/dL (ref 8.7–10.3)
Chloride: 101 mmol/L (ref 96–106)
Creatinine, Ser: 0.68 mg/dL (ref 0.57–1.00)
Glucose: 89 mg/dL (ref 70–99)
Potassium: 4.4 mmol/L (ref 3.5–5.2)
Sodium: 140 mmol/L (ref 134–144)
eGFR: 88 mL/min/1.73 (ref >59)

## 2024-05-30 LAB — CBC
Hematocrit: 44.7 % (ref 34.0–46.6)
Hemoglobin: 14.6 g/dL (ref 11.1–15.9)
MCH: 33.1 pg — ABNORMAL HIGH (ref 26.6–33.0)
MCHC: 32.7 g/dL (ref 31.5–35.7)
MCV: 101 fL — ABNORMAL HIGH (ref 79–97)
Platelets: 277 x10E3/uL (ref 150–450)
RBC: 4.41 x10E6/uL (ref 3.77–5.28)
RDW: 12.5 % (ref 11.7–15.4)
WBC: 5.7 x10E3/uL (ref 3.4–10.8)

## 2024-05-30 LAB — MAGNESIUM: Magnesium: 2.5 mg/dL — ABNORMAL HIGH (ref 1.6–2.3)

## 2024-05-30 NOTE — Progress Notes (Signed)
The patient has been notified of the result and verbalized understanding.  All questions (if any) were answered.     

## 2024-06-03 ENCOUNTER — Ambulatory Visit
Admission: RE | Admit: 2024-06-03 | Discharge: 2024-06-03 | Disposition: A | Discharge status: Home / Self Care | Payer: Medicare (Managed Care) | Source: Ambulatory Visit | Attending: Gastroenterology | Admitting: Gastroenterology

## 2024-06-03 DIAGNOSIS — B181 Chronic viral hepatitis B without delta-agent: Secondary | ICD-10-CM

## 2024-07-02 ENCOUNTER — Other Ambulatory Visit: Payer: Self-pay | Admitting: *Deleted

## 2024-07-02 ENCOUNTER — Ambulatory Visit: Payer: Medicare (Managed Care) | Attending: Internal Medicine

## 2024-07-02 DIAGNOSIS — R002 Palpitations: Secondary | ICD-10-CM

## 2024-07-03 ENCOUNTER — Ambulatory Visit: Payer: Self-pay | Admitting: Internal Medicine

## 2024-07-11 ENCOUNTER — Ambulatory Visit: Payer: Medicare (Managed Care) | Admitting: Oncology

## 2024-07-11 ENCOUNTER — Other Ambulatory Visit: Payer: Medicare (Managed Care)

## 2024-08-06 ENCOUNTER — Other Ambulatory Visit: Payer: Self-pay

## 2024-08-06 DIAGNOSIS — R198 Other specified symptoms and signs involving the digestive system and abdomen: Secondary | ICD-10-CM

## 2024-08-06 DIAGNOSIS — R1030 Lower abdominal pain, unspecified: Secondary | ICD-10-CM

## 2024-08-06 DIAGNOSIS — Z8619 Personal history of other infectious and parasitic diseases: Secondary | ICD-10-CM

## 2024-08-06 DIAGNOSIS — R110 Nausea: Secondary | ICD-10-CM

## 2024-08-06 NOTE — Progress Notes (Unsigned)
" °  Cardiology Office Note   Date:  08/06/2024  ID:  Kathia Covington, DOB April 10, 1946, MRN [id number] PCP: Rolinda Millman, MD  [facility] Providers Cardiologist:  None { Click to update primary MD,subspecialty MD or APP then REFRESH:1}    History of Present Illness Sandra Mcbride is a 79 y.o. female with history of hypertension, hyperlipidemia, palpitations, and alcohol use.     [facility] in the setting of presyncope.  She had a left shoulder replacement 03/2024. She noted occasionally having a funny sensation in her stomach, getting lightheaded, feel faint, and having palpitations. She would eat peanut butter or drink orange juice, and feel better. She noted the symptoms started after her surgery. She was encouraged to hydrate, eat small regular meals, and a 30 day monitor was placed. CBC and BMP unremarkable.   Zio monitor results 07/03/2024 showed predominant underlying rhythm NSR (average HR 79) with rare supraventricular ectopy . No high grade AV heart block, a-fib, or long pauses noted.     She is here today for follow up to discuss her heart monitor results.   Palpitations- Zio monitor results 07/03/2024 showed predominant underlying rhythm NSR (average HR 79) with rare supraventricular ectopy . No high grade AV heart block, a-fib, or long pauses noted. Today she notes ***  Hypertension- BP today ***. BP at home ***. Discussed to monitor BP at home at least 2 hours after medications and sitting for 5-10 minutes. BP well controlled. Continue current antihypertensive regimen. Continue to follow with PCP.  Continue losartan  100 mg.   Hyperlipidemia- Last LDL 83 on 21/18/25. Heart healthy diet and regular cardiovascular exercise encouraged. Continue to follow with PCP. Continue atorvastatin  40 mg.   Alcohol use-   ROS: All systems negative unless otherwise indicated in HPI.   Studies Reviewed      *** Risk Assessment/Calculations   No  BP recorded.  {Refresh Note OR Click here to enter BP  :1}***       Physical Exam VS:  There were no vitals taken for this visit.       Wt Readings from Last 3 Encounters:  05/29/24 149 lb (67.6 kg)  01/10/24 157 lb (71.2 kg)  09/27/20 157 lb 6.4 oz (71.4 kg)    GEN: Well nourished, well developed in no acute distress NECK: No JVD; No carotid bruits CARDIAC: ***RRR, no murmurs, rubs, gallops RESPIRATORY:  Clear to auscultation without rales, wheezing or rhonchi  ABDOMEN: Soft, non-tender, non-distended EXTREMITIES:  No edema; No deformity   ASSESSMENT AND PLAN ***    {Are you ordering a CV Procedure (e.g. stress test, cath, DCCV, TEE, etc)?   Press F2        :[id number]}  Dispo: ***  Signed, Mardy KATHEE Pizza, FNP  "

## 2024-08-07 ENCOUNTER — Ambulatory Visit: Payer: Medicare (Managed Care) | Admitting: General Practice

## 2024-08-12 ENCOUNTER — Ambulatory Visit
Admission: RE | Admit: 2024-08-12 | Discharge: 2024-08-12 | Disposition: A | Payer: Medicare (Managed Care) | Source: Ambulatory Visit

## 2024-08-12 ENCOUNTER — Telehealth: Payer: Self-pay

## 2024-08-12 DIAGNOSIS — R198 Other specified symptoms and signs involving the digestive system and abdomen: Secondary | ICD-10-CM

## 2024-08-12 DIAGNOSIS — R1030 Lower abdominal pain, unspecified: Secondary | ICD-10-CM

## 2024-08-12 DIAGNOSIS — R110 Nausea: Secondary | ICD-10-CM

## 2024-08-12 DIAGNOSIS — Z8619 Personal history of other infectious and parasitic diseases: Secondary | ICD-10-CM

## 2024-08-12 MED ORDER — IOPAMIDOL (ISOVUE-300) INJECTION 61%: 100.0000 mL | Freq: Once | INTRAVENOUS | Status: DC | PRN

## 2024-08-12 MED ORDER — PREDNISONE 50 MG PO TABS: ORAL_TABLET | ORAL | 0 refills | Status: DC

## 2024-08-12 MED ORDER — PREDNISONE 50 MG PO TABS: ORAL_TABLET | ORAL | 0 refills | Status: AC

## 2024-08-12 NOTE — Telephone Encounter (Signed)
 See telephone note

## 2024-08-12 NOTE — Progress Notes (Unsigned)
 " Cardiology Office Note:    Date:  08/13/2024   ID:  Sandra Mcbride, DOB 04-06-1946, MRN [id number]  PCP:  Rolinda Millman, MD   [facility] Providers Cardiologist:  None {  Referring MD: Rolinda Millman, MD   Chief Complaint  Patient presents with   Follow-up    palpitations   History of Present Illness:    Sandra Mcbride is a 79 y.o. female with a hx of hypertension, hyperlipidemia, palpitations.   [facility] in the setting of dizziness, lightheadedness.  She had a left shoulder replacement 03/2024. She noted occasionally having a funny sensation in her stomach, getting lightheaded, feel faint, and having palpitations. She would eat peanut butter or drink orange juice, and feel better. She noted the symptoms started after her surgery. She was encouraged to hydrate, eat small regular meals, and a 30 day monitor was placed. CBC and BMP unremarkable.   Zio monitor results 07/03/2024 showed predominant underlying rhythm NSR (average HR 79) with rare supraventricular ectopy . No high grade AV heart block, a-fib, or long pauses noted.   She is here today for follow up to discuss her heart monitor results. She reports just mild symptoms, only 2 episodes, since last being seen. She tells me that she has been trying to increase her PO intake, believes this has helped with her symptoms. She brings up her son, who passed from an oxycodone overdose. During her son's autopsy they noted left ventricular hypertrophy on his death certificate and she was confused by this. We discussed what his meant, along with the pathophysiology. She noted that her son was overweight and had high blood pressure, which was likely the cause of this. We discussed that it did not likely play a role in his death.   Past Medical History:  Diagnosis Date   Arthritis    Hyperlipidemia    Hypertension    Past Surgical History:  Procedure Laterality Date   ABDOMINAL  HYSTERECTOMY  1980s   partial, cervix remains, for fibroids   APPENDECTOMY     EYE SURGERY     cataracts, both eye   JOINT REPLACEMENT N/A    [employer] 01/13/2020   Current Medications: Current Outpatient Medications  Medication Instructions   atorvastatin  (LIPITOR) 40 mg, Oral, Daily-1800   losartan  (COZAAR ) 100 MG tablet Take 1 tablet by mouth once daily   Multiple Vitamin (MULTI VITAMIN) TABS 1 tablet, Daily   predniSONE  (DELTASONE ) 50 MG tablet Pt to take 50 mg of prednisone  on 08/13/2024 at 10 pm, 50 mg of prednisone  on 08/14/2024 at 4 am, and 50 mg of prednisone  on 08/14/2024 at 10 am. Pt is also to take 50 mg of benadryl on 08/14/2024 at 10 am. Please call [phone] with any questions.   Probiotic TBEC 1 tablet, Daily   Allergies:   Iohexol   Social History   Socioeconomic History   Marital status: Widowed    Spouse name: Not on file   Number of children: Not on file   Years of education: Not on file   Highest education level: Not on file  Occupational History   Not on file  Tobacco Use   Smoking status: Never   Smokeless tobacco: Never  Substance and Sexual Activity   Alcohol use: Yes    Alcohol/week: 20.0 standard drinks of alcohol    Types: 20 Standard drinks or equivalent per week   Drug use: Never   Sexual activity: Not Currently  Other Topics Concern   Not on file  Social History Narrative   Not on file   Social Drivers of Health   Tobacco Use: Low Risk (08/13/2024)   Patient History    Smoking Tobacco Use: Never    Smokeless Tobacco Use: Never    Passive Exposure: Not on file  Financial Resource Strain: Not on file  Food Insecurity: Food Insecurity Present (01/10/2024)   Epic    Worried About Programme Researcher, Broadcasting/film/video in the Last Year: Sometimes true    [employer] of Food in the Last Year: Sometimes true  Transportation Needs: No Transportation Needs (01/10/2024)   Epic    Lack of Transportation (Medical): No    Lack of Transportation (Non-Medical): No   Physical Activity: Not on file  Stress: Not on file  Social Connections: Not on file  Depression (PHQ2-9): Medium Risk (01/10/2024)   Depression (PHQ2-9)    PHQ-2 Score: 6  Alcohol Screen: Not on file  Housing: Unknown (01/10/2024)   Epic    Unable to Pay for Housing in the Last Year: No    Number of Times Moved in the Last Year: Not on file    Homeless in the Last Year: No  Utilities: Not At Risk (01/10/2024)   Epic    Threatened with loss of utilities: No  Health Literacy: Not on file    Family History: The patient's family history includes Heart disease in her brother, daughter, father, and mother; Hypertension in her brother, daughter, father, and mother; Intellectual disability in her son; Schizophrenia in her son.  ROS:   Please see the history of present illness.    All other systems reviewed and are negative.  EKGs/Labs/Other Studies Reviewed:    The following studies were reviewed today:      Recent Labs: 01/10/2024: ALT 12; TSH 2.470 05/29/2024: BUN 9; Creatinine, Ser 0.68; Hemoglobin 14.6; Magnesium 2.5; Platelets 277; Potassium 4.4; Sodium 140  Recent Lipid Panel    Component Value Date/Time   CHOL 169 09/27/2020 1402   TRIG 66 09/27/2020 1402   HDL 75 09/27/2020 1402   CHOLHDL 2.3 09/27/2020 1402   LDLCALC 81 09/27/2020 1402   Risk Assessment/Calculations:     HYPERTENSION CONTROL Vitals:   08/13/24 1031 08/13/24 1036  BP: (!) 140/78 (!) 136/90    The patient's blood pressure is elevated above target today.  In order to address the patient's elevated BP: Blood pressure will be monitored at home to determine if medication changes need to be made.     Patient did not take AM medications prior to visit today.       Physical Exam:    VS:  BP (!) 136/90   Pulse 67   Ht 5' 6.5 (1.689 m)   Wt 152 lb (68.9 kg)   SpO2 93%   BMI 24.17 kg/m     Wt Readings from Last 3 Encounters:  08/13/24 152 lb (68.9 kg)  05/29/24 149 lb (67.6 kg)  01/10/24 157  lb (71.2 kg)    GEN:  Well nourished, well developed in no acute distress HEENT: Normal NECK: No JVD; No carotid bruits LYMPHATICS: No lymphadenopathy CARDIAC: RRR, no murmurs, rubs, gallops RESPIRATORY:  Clear to auscultation without rales, wheezing or rhonchi  ABDOMEN: Soft, non-tender, non-distended MUSCULOSKELETAL:  No edema; No deformity  SKIN: Warm and dry NEUROLOGIC:  Alert and oriented x 3 PSYCHIATRIC:  Normal affect   ASSESSMENT:    1. Palpitations   2. Primary hypertension  3. Pure hypercholesterolemia    PLAN:    Palpitations Zio monitor 07/03/2024: NSR (average HR 79) with rare supraventricular ectopy <1% burden We reviewed her monitor results and labs She reports just 2 episodes of palpitations since last being seen No significant symptoms  HR today 67  Discussed ensuring proper hydration and eating small, regular meals  No further cardiac workup recommended at this time  Hypertension Home meds: losartan  100 mg daily  BP today 136/90 -- patient has not taking AM meds yet SBP at home typically 120s Discussed to monitor BP at home at least 2 hours after medications and sitting for 5-10 minutes.  Reports well controlled BP at home Typically managed by PCP Continue losartan  100 mg Continue to follow up with PCP  Hyperlipidemia Home meds: Lipitor 40 mg daily Managed by PCP Last LDL 83 as of 06/2024 Continue current regimen  Continue to follow up with PCP  Follow up: as needed with our office     Medication Adjustments/Labs and Tests Ordered: Current medicines are reviewed at length with the patient today.  Concerns regarding medicines are outlined above.  No orders of the defined types were placed in this encounter.  No orders of the defined types were placed in this encounter.  Patient Instructions  Medication Instructions:   Your physician recommends that you continue on your current medications as directed. Please refer to the Current Medication  list given to you today.  *If you need a refill on your cardiac medications before your next appointment, please call your pharmacy*   Lab Work: NONE ORDERED  TODAY   If you have labs (blood work) drawn today and your tests are completely normal, you will receive your results only by: MyChart Message (if you have MyChart) OR A paper copy in the mail If you have any lab test that is abnormal or we need to change your treatment, we will call you to review the results.  Testing/Procedures:  NONE ORDERED  TODAY    Follow-Up: At [facility], you and your health needs are our priority.  As part of our continuing mission to provide you with exceptional heart care, our providers are all part of one team.  This team includes your primary Cardiologist (physician) and Advanced Practice Providers or APPs (Physician Assistants and Nurse Practitioners) who all work together to provide you with the care you need, when you need it.  Your next appointment:   CONTACT [facility] [phone] AS NEEDED FOR  ANY CARDIAC RELATED SYMPTOM  We recommend signing up for the patient portal called MyChart.  Sign up information is provided on this After Visit Summary.  MyChart is used to connect with patients for Virtual Visits (Telemedicine).  Patients are able to view lab/test results, encounter notes, upcoming appointments, etc.  Non-urgent messages can be sent to your provider as well.   To learn more about what you can do with MyChart, go to [email or web address].   Other Instructions             Signed, Waddell DELENA Donath, PA-C  08/13/2024 11:01 AM     [facility] "

## 2024-08-12 NOTE — Telephone Encounter (Signed)
 See telephone note  13 hour prep  Pt to take 50 mg of prednisone  on 08/13/2024 at 10 pm, 50 mg of prednisone  on 08/14/2024 at 4 am, and 50 mg of prednisone  on 08/14/2024 at 10 am. Pt is also to take 50 mg of benadryl on 08/14/2024 at 10 am. Please call [phone] with any questions.

## 2024-08-13 ENCOUNTER — Ambulatory Visit: Payer: Medicare (Managed Care) | Admitting: Cardiology

## 2024-08-13 ENCOUNTER — Inpatient Hospital Stay: Admission: RE | Admit: 2024-08-13 | Payer: Medicare (Managed Care) | Source: Ambulatory Visit

## 2024-08-13 ENCOUNTER — Encounter: Payer: Self-pay | Admitting: Cardiology

## 2024-08-13 VITALS — BP 136/90 | HR 67 | Ht 66.5 in | Wt 152.0 lb

## 2024-08-13 DIAGNOSIS — R002 Palpitations: Secondary | ICD-10-CM

## 2024-08-13 DIAGNOSIS — I10 Essential (primary) hypertension: Secondary | ICD-10-CM

## 2024-08-13 DIAGNOSIS — E7800 Pure hypercholesterolemia, unspecified: Secondary | ICD-10-CM | POA: Diagnosis not present

## 2024-08-13 NOTE — Patient Instructions (Signed)
 Medication Instructions:   Your physician recommends that you continue on your current medications as directed. Please refer to the Current Medication list given to you today.  *If you need a refill on your cardiac medications before your next appointment, please call your pharmacy*   Lab Work: NONE ORDERED  TODAY   If you have labs (blood work) drawn today and your tests are completely normal, you will receive your results only by: MyChart Message (if you have MyChart) OR A paper copy in the mail If you have any lab test that is abnormal or we need to change your treatment, we will call you to review the results.  Testing/Procedures:  NONE ORDERED  TODAY    Follow-Up: At [facility], you and your health needs are our priority.  As part of our continuing mission to provide you with exceptional heart care, our providers are all part of one team.  This team includes your primary Cardiologist (physician) and Advanced Practice Providers or APPs (Physician Assistants and Nurse Practitioners) who all work together to provide you with the care you need, when you need it.  Your next appointment:   CONTACT [facility] [phone] AS NEEDED FOR  ANY CARDIAC RELATED SYMPTOM  We recommend signing up for the patient portal called MyChart.  Sign up information is provided on this After Visit Summary.  MyChart is used to connect with patients for Virtual Visits (Telemedicine).  Patients are able to view lab/test results, encounter notes, upcoming appointments, etc.  Non-urgent messages can be sent to your provider as well.   To learn more about what you can do with MyChart, go to [email or web address].   Other Instructions

## 2024-08-14 ENCOUNTER — Ambulatory Visit
Admission: RE | Admit: 2024-08-14 | Discharge: 2024-08-14 | Disposition: A | Discharge status: Home / Self Care | Payer: Medicare (Managed Care) | Source: Ambulatory Visit

## 2024-08-14 MED ORDER — IOPAMIDOL (ISOVUE-300) INJECTION 61%
100.0000 mL | Freq: Once | INTRAVENOUS | Status: AC | PRN
  Administered 2024-08-14: 100 mL via INTRAVENOUS
